# Patient Record
Sex: Female | Born: 1985 | Race: Black or African American | Hispanic: No | Marital: Single | Smoking: Former smoker
Health system: Southern US, Community
[De-identification: ages and names within clinical notes are randomized; demographics above are authoritative.]

## PROBLEM LIST (undated history)

## (undated) DIAGNOSIS — F329 Major depressive disorder, single episode, unspecified: Secondary | ICD-10-CM

## (undated) DIAGNOSIS — J45909 Unspecified asthma, uncomplicated: Secondary | ICD-10-CM

## (undated) DIAGNOSIS — F32A Depression, unspecified: Secondary | ICD-10-CM

## (undated) DIAGNOSIS — D649 Anemia, unspecified: Secondary | ICD-10-CM

## (undated) DIAGNOSIS — N888 Other specified noninflammatory disorders of cervix uteri: Secondary | ICD-10-CM

## (undated) DIAGNOSIS — F419 Anxiety disorder, unspecified: Secondary | ICD-10-CM

---
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
## 2016-08-12 ENCOUNTER — Emergency Department
Admission: EM | Admit: 2016-08-12 | Discharge: 2016-08-13 | Disposition: A | Discharge status: Home / Self Care | Payer: Self-pay | Attending: Student | Admitting: Student

## 2016-08-12 ENCOUNTER — Emergency Department: Payer: Self-pay

## 2016-08-12 ENCOUNTER — Encounter: Payer: Self-pay | Admitting: Emergency Medicine

## 2016-08-12 DIAGNOSIS — F17210 Nicotine dependence, cigarettes, uncomplicated: Secondary | ICD-10-CM | POA: Insufficient documentation

## 2016-08-12 DIAGNOSIS — R58 Hemorrhage, not elsewhere classified: Secondary | ICD-10-CM

## 2016-08-12 DIAGNOSIS — N939 Abnormal uterine and vaginal bleeding, unspecified: Secondary | ICD-10-CM | POA: Insufficient documentation

## 2016-08-12 LAB — COMPREHENSIVE METABOLIC PANEL
ALT: 12 U/L — AB (ref 14–54)
ANION GAP: 6 (ref 5–15)
AST: 23 U/L (ref 15–41)
Albumin: 4.2 g/dL (ref 3.5–5.0)
Alkaline Phosphatase: 52 U/L (ref 38–126)
BUN: 19 mg/dL (ref 6–20)
CHLORIDE: 107 mmol/L (ref 101–111)
CO2: 25 mmol/L (ref 22–32)
CREATININE: 0.7 mg/dL (ref 0.44–1.00)
Calcium: 9.1 mg/dL (ref 8.9–10.3)
Glucose, Bld: 100 mg/dL — ABNORMAL HIGH (ref 65–99)
POTASSIUM: 4.2 mmol/L (ref 3.5–5.1)
SODIUM: 138 mmol/L (ref 135–145)
Total Bilirubin: 0.5 mg/dL (ref 0.3–1.2)
Total Protein: 7.4 g/dL (ref 6.5–8.1)

## 2016-08-12 LAB — URINALYSIS COMPLETE WITH MICROSCOPIC (ARMC ONLY)
BACTERIA UA: NONE SEEN
BILIRUBIN URINE: NEGATIVE
GLUCOSE, UA: NEGATIVE mg/dL
KETONES UR: NEGATIVE mg/dL
LEUKOCYTES UA: NEGATIVE
NITRITE: NEGATIVE
PH: 7 (ref 5.0–8.0)
Protein, ur: NEGATIVE mg/dL
SPECIFIC GRAVITY, URINE: 1.017 (ref 1.005–1.030)
WBC, UA: NONE SEEN WBC/hpf (ref 0–5)

## 2016-08-12 LAB — CBC
HCT: 37.3 % (ref 35.0–47.0)
Hemoglobin: 12.4 g/dL (ref 12.0–16.0)
MCH: 28.2 pg (ref 26.0–34.0)
MCHC: 33.3 g/dL (ref 32.0–36.0)
MCV: 84.7 fL (ref 80.0–100.0)
PLATELETS: 196 10*3/uL (ref 150–440)
RBC: 4.41 MIL/uL (ref 3.80–5.20)
RDW: 15.4 % — AB (ref 11.5–14.5)
WBC: 6.9 10*3/uL (ref 3.6–11.0)

## 2016-08-12 LAB — POCT PREGNANCY, URINE: PREG TEST UR: NEGATIVE

## 2016-08-12 LAB — WET PREP, GENITAL
SPERM: NONE SEEN
TRICH WET PREP: NONE SEEN
YEAST WET PREP: NONE SEEN

## 2016-08-12 MED ORDER — IBUPROFEN 600 MG PO TABS
600.0000 mg | ORAL_TABLET | Freq: Once | ORAL | Status: AC
  Administered 2016-08-12: 600 mg via ORAL

## 2016-08-12 MED ORDER — IBUPROFEN 600 MG PO TABS
ORAL_TABLET | ORAL | Status: AC
  Administered 2016-08-12: 600 mg via ORAL
  Filled 2016-08-12: qty 1

## 2016-08-12 NOTE — ED Notes (Signed)
Patient transported to Ultrasound 

## 2016-08-12 NOTE — ED Triage Notes (Addendum)
Patient ambulatory to triage with steady gait, without difficulty or distress noted; pt reports vag bleeding x 3 weeks with lower abd cramping (LMP 7/24)

## 2016-08-12 NOTE — ED Provider Notes (Signed)
[facility] Emergency Department Provider Note   ____________________________________________   First MD Initiated Contact with Patient 08/12/16 2239     (approximate)  I have reviewed the triage vital signs and the nursing notes.   HISTORY  Chief Complaint Vaginal Bleeding    HPI [name] is a 30 y.o. female with no chronic medical problems who presents for evaluation of intermittent vaginal bleeding since 07/15/2016, currently mild, no modifying factors, gradual onset. Patient reports that since 07/15/2016 she has had vaginal bleeding and spotting on most days though she has had several days where she does not have vaginal bleeding. She is changed to regular size pads today, she changed 4 pads yesterday. She has had some lower abdominal cramping. No nausea, vomiting, diarrhea, fevers or chills. She has a history of GC chlamydia which were treated remotely. She denies any syncopal episodes or near fainting spells but reports that today when she was looking up at the clinic she began feeling somewhat lightheaded and this concerned her and in the setting of her vaginal bleeding, she decided to present for evaluation. No vision change.   History reviewed. No pertinent past medical history.  There are no active problems to display for this patient.   History reviewed. No pertinent surgical history.  Prior to Admission medications   Not on File    Allergies Demerol [meperidine]  No family history on file.  Social History Social History  Substance Use Topics  . Smoking status: Current Every Day Smoker    Packs/day: 0.50    Types: Cigarettes  . Smokeless tobacco: Never Used  . Alcohol use No    Review of Systems Constitutional: No fever/chills Eyes: No visual changes. ENT: No sore throat. Cardiovascular: Denies chest pain. Respiratory: Denies shortness of breath. Gastrointestinal: +abdominal cramping.  No nausea, no vomiting.  No diarrhea.   No constipation. Genitourinary: Negative for dysuria. Musculoskeletal: Negative for back pain. Skin: Negative for rash. Neurological: Negative for headaches, focal weakness or numbness.  10-point ROS otherwise negative.  ____________________________________________   PHYSICAL EXAM:  VITAL SIGNS: ED Triage Vitals  Enc Vitals Group     BP 08/12/16 1920 (!) 139/91     Pulse Rate 08/12/16 1920 82     Resp 08/12/16 1920 18     Temp 08/12/16 1920 98.2 F (36.8 C)     Temp Source 08/12/16 1920 Oral     SpO2 08/12/16 1920 100 %     Weight 08/12/16 1919 130 lb (59 kg)     Height 08/12/16 1919 5\' 7"  (1.702 m)     Head Circumference --      Peak Flow --      Pain Score 08/12/16 1916 9     Pain Loc --      Pain Edu? --      Excl. in GC? --     Constitutional: Alert and oriented. Well appearing and in no acute distress. Eyes: Conjunctivae are normal. PERRL. EOMI. Head: Atraumatic. Nose: No congestion/rhinnorhea. Mouth/Throat: Mucous membranes are moist.  Oropharynx non-erythematous. Neck: No stridor.   Cardiovascular: Normal rate, regular rhythm. Grossly normal heart sounds.  Good peripheral circulation. Respiratory: Normal respiratory effort.  No retractions. Lungs CTAB. Gastrointestinal: Soft and nontender. No distention.No CVA tenderness. Pelvic: Tiny specks of dark blood in the vaginal vault, closed os, no bimanual cervical motion tenderness. Musculoskeletal: No lower extremity tenderness nor edema.  No joint effusions. Neurologic:  Normal speech and language. No gross focal neurologic deficits are appreciated.  No gait instability. Skin:  Skin is warm, dry and intact. No rash noted. Psychiatric: Mood and affect are normal. Speech and behavior are normal.  ____________________________________________   LABS (all labs ordered are listed, but only abnormal results are displayed)  Labs Reviewed  URINALYSIS COMPLETEWITH MICROSCOPIC ([facility] ONLY) - Abnormal; Notable for the  following:       Result Value   Color, Urine YELLOW (*)    APPearance CLOUDY (*)    Hgb urine dipstick 1+ (*)    Squamous Epithelial / LPF 0-5 (*)    All other components within normal limits  CBC - Abnormal; Notable for the following:    RDW 15.4 (*)    All other components within normal limits  COMPREHENSIVE METABOLIC PANEL - Abnormal; Notable for the following:    Glucose, Bld 100 (*)    ALT 12 (*)    All other components within normal limits  CHLAMYDIA/NGC RT PCR ([facility] ONLY)  WET PREP, GENITAL  POC URINE PREG, ED  POCT PREGNANCY, URINE   ____________________________________________  EKG  none ____________________________________________  RADIOLOGY  Transvaginal ultrasound - pending ____________________________________________   PROCEDURES  Procedure(s) performed: None  Procedures  Critical Care performed: No  ____________________________________________   INITIAL IMPRESSION / ASSESSMENT AND PLAN / ED COURSE  Pertinent labs & imaging results that were available during my care of the patient were reviewed by me and considered in my medical decision making (see chart for details).  Erica Midgetshley Bargo is a 30 y.o. female with no chronic medical problems who presents for evaluation of intermittent vaginal bleeding since 07/15/2016, currently mild. On exam, she is very well-appearing and in no acute distress. Vital signs stable, she is afebrile. She is a benign abdominal exam. Pelvic exam reveals minimal vaginal bleeding, no I'm able or cervical motion tenderness not consistent with PID. Pregnancy test is negative. CBC shows normal hemoglobin of 12.4. CMP unremarkable. Urinalysis is not consistent with infection, 0-5 red blood cells likely contaminant from her vaginal bleeding. GC chlamydia and wet prep pending. Transvaginal ultrasound also pending. If unremarkable, anticipate that the patient can be discharged with close outpatient OB/GYN follow-up. Care transferred  to Dr. Langston MaskerShaevitz at 11 PM pending GC chlamydia, wet prep as well as transvaginal ultrasound.  Clinical Course     ____________________________________________   FINAL CLINICAL IMPRESSION(S) / ED DIAGNOSES  Final diagnoses:  Bleeding  Abnormal uterine bleeding (AUB)      NEW MEDICATIONS STARTED DURING THIS VISIT:  New Prescriptions   No medications on file     Note:  This document was prepared using Dragon voice recognition software and may include unintentional dictation errors.    Gayla DossEryka A Anuj Summons, MD 08/12/16 2306

## 2016-08-12 NOTE — ED Notes (Signed)
Pt was up to BR to obtain urine speciment, but unable to; pt has container and voices understanding to bring back to nurse once completed

## 2016-08-13 LAB — CHLAMYDIA/NGC RT PCR (ARMC ONLY)
CHLAMYDIA TR: NOT DETECTED
N GONORRHOEAE: NOT DETECTED

## 2016-08-13 NOTE — ED Provider Notes (Signed)
Signout from Dr. Inocencio HomesGayle in this 30 year old female with mild vaginal bleeding or Dr. Inocencio HomesGayle suspecting likely dysfunctional uterine bleeding.  With chronic bacterial vaginosis. Plan is to follow up with Dr. Tiburcio PeaHarris of obstetrics and gynecology as long as her ultrasound does not come back with any concerning acute pathology that will require urgent treatment. Physical Exam  BP (!) 125/94   Pulse 81   Temp 98.2 F (36.8 C) (Oral)   Resp 16   Ht 5\' 7"  (1.702 m)   Wt 130 lb (59 kg)   LMP 07/15/2016 (Exact Date)   SpO2 98%   BMI 20.36 kg/m  ----------------------------------------- 12:56 AM on 08/13/2016 -----------------------------------------   Physical Exam Patient sleeping when I entered the room. No acute distress. Easily arousable. ED Course  Procedures  MDM  US Transvaginal Non-OB (Accession [id number]) (Order [id number])  Imaging  Date: 08/12/2016 Department: [facility] EMERGENCY DEPARTMENT Released By/Authorizing: Gayla DossEryka A Gayle, MD (auto-released)  PACS Images   Show images for US Transvaginal Non-OB  Study Result   CLINICAL DATA:  Vaginal bleeding for 3 weeks.  EXAM: TRANSABDOMINAL AND TRANSVAGINAL ULTRASOUND OF PELVIS  TECHNIQUE: Both transabdominal and transvaginal ultrasound examinations of the pelvis were performed. Transabdominal technique was performed for global imaging of the pelvis including uterus, ovaries, adnexal regions, and pelvic cul-de-sac. It was necessary to proceed with endovaginal exam following the transabdominal exam to visualize the uterus, endometrium, ovaries and adnexa.  COMPARISON:  None  FINDINGS: Uterus  Measurements: 7.5 x 4.8 x 5.5 cm. No fibroids or other mass visualized.  Endometrium  Thickness: 10-12 mm. Trilaminar in appearance. Echogenic fluid noted in the endometrial canal consistent with blood products. No hyperemia.  Right ovary  Measurements: 3.2 x 1.8 x 2.2 cm. Multiple follicles,  peripherally oriented, but not increased in number. Blood flow is seen. No adnexal mass.  Left ovary  Measurements: 4.2 x 2.2 x 3.9 cm. Follicular cysts largest measuring 1.4 cm. Normal blood flow. No adnexal mass.  Other findings  Within the cervix there is a 1.3 x 1.1 x 1.1 cm rounded lesion with homogeneous internal echoes, increased through transmission, and no internal vascularity. No abnormal free fluid.  IMPRESSION: 1. Echogenic fluid in the endometrial canal consistent with blood products. Normal endometrial thickness 4 premenopausal patient. If bleeding is unresponsive to hormonal or medical therapy, sonohysterography recommended for focal lesion workup. 2. Cervical lesion is most consistent with a complex cyst, possible nabothian cyst. 3. Physiologic follicles in both ovaries with small follicular cysts on the left. Normal blood flow.   Electronically Signed   By: Rubye OaksMelanie  Ehinger M.D.   On: 08/13/2016 00:22   I reviewed the imaging results with the patient. She is understanding of the plan for follow-up as an outpatient.      Myrna Blazeravid Matthew Schaevitz, MD 08/13/16 [phone]

## 2016-10-11 ENCOUNTER — Encounter: Payer: Self-pay | Admitting: *Deleted

## 2016-10-11 ENCOUNTER — Ambulatory Visit
Admission: EM | Admit: 2016-10-11 | Discharge: 2016-10-11 | Disposition: A | Discharge status: Home / Self Care | Payer: Self-pay | Attending: Family Medicine | Admitting: Family Medicine

## 2016-10-11 ENCOUNTER — Telehealth: Payer: Self-pay | Admitting: Emergency Medicine

## 2016-10-11 DIAGNOSIS — Z3490 Encounter for supervision of normal pregnancy, unspecified, unspecified trimester: Secondary | ICD-10-CM

## 2016-10-11 DIAGNOSIS — N898 Other specified noninflammatory disorders of vagina: Secondary | ICD-10-CM

## 2016-10-11 DIAGNOSIS — Z3201 Encounter for pregnancy test, result positive: Secondary | ICD-10-CM

## 2016-10-11 DIAGNOSIS — R300 Dysuria: Secondary | ICD-10-CM

## 2016-10-11 HISTORY — DX: Unspecified asthma, uncomplicated: J45.909

## 2016-10-11 LAB — WET PREP, GENITAL
CLUE CELLS WET PREP: NONE SEEN
Sperm: NONE SEEN
Trich, Wet Prep: NONE SEEN
YEAST WET PREP: NONE SEEN

## 2016-10-11 LAB — URINALYSIS COMPLETE WITH MICROSCOPIC (ARMC ONLY)
Bilirubin Urine: NEGATIVE
GLUCOSE, UA: NEGATIVE mg/dL
Hgb urine dipstick: NEGATIVE
Ketones, ur: NEGATIVE mg/dL
Leukocytes, UA: NEGATIVE
NITRITE: NEGATIVE
Protein, ur: NEGATIVE mg/dL
SPECIFIC GRAVITY, URINE: 1.025 (ref 1.005–1.030)
WBC, UA: NONE SEEN WBC/hpf (ref 0–5)
pH: 7 (ref 5.0–8.0)

## 2016-10-11 LAB — CHLAMYDIA/NGC RT PCR (ARMC ONLY)
Chlamydia Tr: NOT DETECTED
N gonorrhoeae: NOT DETECTED

## 2016-10-11 LAB — PREGNANCY, URINE: PREG TEST UR: POSITIVE — AB

## 2016-10-11 MED ORDER — MAGNESIUM HYDROXIDE 400 MG/5ML PO SUSP: 30.0000 mL | Freq: Once | ORAL | Status: DC

## 2016-10-11 MED ORDER — NITROFURANTOIN MONOHYD MACRO 100 MG PO CAPS: 100.0000 mg | ORAL_CAPSULE | Freq: Two times a day (BID) | ORAL | 0 refills | Status: DC

## 2016-10-11 NOTE — ED Provider Notes (Signed)
[facility]    CSN: [id number] Arrival date & time: 10/11/16  1249     History   Chief Complaint Chief Complaint  Patient presents with  . Abdominal Pain  . Urinary Frequency  . Urinary Urgency  . Nausea    HPI [name] is a 30 y.o. female.   30 yo female with a c/o lower mid abdomen/pelvic pressure sensation,  vaginal discharge, urinary frequency and urgency as well as nausea x2 weeks. Denies any fevers, chills, vomiting.        The history is provided by the patient.    Past Medical History:  Diagnosis Date  . Asthma     There are no active problems to display for this patient.   History reviewed. No pertinent surgical history.  OB History    No data available       Home Medications    Prior to Admission medications   Medication Sig Start Date End Date Taking? Authorizing Provider  albuterol (PROVENTIL HFA;VENTOLIN HFA) 108 (90 Base) MCG/ACT inhaler Inhale into the lungs every 6 (six) hours as needed for wheezing or shortness of breath.    Historical Provider, MD  nitrofurantoin, macrocrystal-monohydrate, (MACROBID) 100 MG capsule Take 1 capsule (100 mg total) by mouth 2 (two) times daily. 10/11/16   Payton Mccallum, MD    Family History History reviewed. No pertinent family history.  Social History Social History  Substance Use Topics  . Smoking status: Current Every Day Smoker    Packs/day: 0.50    Types: Cigarettes  . Smokeless tobacco: Never Used  . Alcohol use No     Allergies   Demerol [meperidine]   Review of Systems Review of Systems   Physical Exam Triage Vital Signs ED Triage Vitals  Enc Vitals Group     BP 10/11/16 1303 123/71     Pulse Rate 10/11/16 1303 74     Resp 10/11/16 1303 16     Temp 10/11/16 1303 99.2 F (37.3 C)     Temp Source 10/11/16 1303 Oral     SpO2 10/11/16 1303 99 %     Weight 10/11/16 1305 130 lb (59 kg)     Height 10/11/16 1305  (1.702 m)     Head Circumference --    Peak Flow --      Pain Score --      Pain Loc --      Pain Edu? --      Excl. in GC? --    No data found.   Updated Vital Signs BP 123/71 (BP Location: Left Arm)   Pulse 74   Temp 99.2 F (37.3 C) (Oral)   Resp 16   Ht  (1.702 m)   Wt 130 lb (59 kg)   LMP 08/31/2016 (Exact Date)   SpO2 99%   BMI 20.36 kg/m   Visual Acuity Right Eye Distance:   Left Eye Distance:   Bilateral Distance:    Right Eye Near:   Left Eye Near:    Bilateral Near:     Physical Exam  Constitutional: She appears well-developed.  Pulmonary/Chest: Effort normal. No respiratory distress.  Abdominal: Soft. Bowel sounds are normal. She exhibits no distension and no mass. There is tenderness. There is no rebound and no guarding.  Genitourinary: Pelvic exam was performed with patient supine. Cervix exhibits no discharge and no friability. Vaginal discharge found.  Neurological: She is alert.  Skin: Skin is warm and dry. No rash noted. She  is not diaphoretic. No erythema.  Nursing note and vitals reviewed.    UC Treatments / Results  Labs (all labs ordered are listed, but only abnormal results are displayed) Labs Reviewed  WET PREP, GENITAL - Abnormal; Notable for the following:       Result Value   WBC, Wet Prep HPF POC MODERATE (*)    All other components within normal limits  URINALYSIS COMPLETEWITH MICROSCOPIC ([facility] ONLY) - Abnormal; Notable for the following:    APPearance HAZY (*)    Bacteria, UA FEW (*)    Squamous Epithelial / LPF 6-30 (*)    All other components within normal limits  PREGNANCY, URINE - Abnormal; Notable for the following:    Preg Test, Ur POSITIVE (*)    All other components within normal limits  CHLAMYDIA/NGC RT PCR [facility] ONLY)    EKG  EKG Interpretation None       Radiology No results found.  Procedures Procedures (including critical care time)  Medications Ordered in UC Medications - No data to display   Initial Impression /  Assessment and Plan / UC Course  I have reviewed the triage vital signs and the nursing notes.  Pertinent labs & imaging results that were available during my care of the patient were reviewed by me and considered in my medical decision making (see chart for details).  Clinical Course      Final Clinical Impressions(s) / UC Diagnoses   Final diagnoses:  Dysuria  Vaginal discharge  Pregnancy, unspecified gestational age    New Prescriptions Discharge Medication List as of 10/11/2016  2:24 PM    START taking these medications   Details  nitrofurantoin, macrocrystal-monohydrate, (MACROBID) 100 MG capsule Take 1 capsule (100 mg total) by mouth 2 (two) times daily., Starting Fri 10/11/2016, Normal       1. Lab results and diagnosis reviewed with patient 2. rx as per orders above; reviewed possible side effects, interactions, risks and benefits  3. Recommend patient start prenatal vitamins and establish with OB/GYN 4. Will be notified of pending tests (GC/Chlamydia) 5. Follow-up prn if symptoms worsen or don't improve   Payton Mccallum, MD 10/11/16 [phone]

## 2016-10-11 NOTE — Telephone Encounter (Signed)
Patient notified that her GC/Chlamydia results were Negative.  Patient verbalized understanding of her lab results.  Patient was instructed to follow-up with her OB as discussed at this visit.  Patient verbalized understanding.

## 2016-10-11 NOTE — ED Triage Notes (Signed)
Lower quad abd pain, vaginal discharge, urinary freq/urg, and nausea x2 weeks.

## 2016-11-13 LAB — OB RESULTS CONSOLE GC/CHLAMYDIA
CHLAMYDIA, DNA PROBE: NEGATIVE
Gonorrhea: NEGATIVE

## 2016-11-13 LAB — OB RESULTS CONSOLE VARICELLA ZOSTER ANTIBODY, IGG: Varicella: IMMUNE

## 2016-11-13 LAB — OB RESULTS CONSOLE HEPATITIS B SURFACE ANTIGEN: Hepatitis B Surface Ag: NEGATIVE

## 2016-11-13 LAB — OB RESULTS CONSOLE RUBELLA ANTIBODY, IGM: RUBELLA: IMMUNE

## 2016-11-25 ENCOUNTER — Other Ambulatory Visit (HOSPITAL_COMMUNITY): Payer: Self-pay | Admitting: Family Medicine

## 2016-11-25 DIAGNOSIS — Z369 Encounter for antenatal screening, unspecified: Secondary | ICD-10-CM

## 2016-11-28 ENCOUNTER — Ambulatory Visit: Admission: RE | Admit: 2016-11-28 | Payer: Self-pay | Source: Ambulatory Visit

## 2016-11-28 ENCOUNTER — Ambulatory Visit: Payer: Self-pay

## 2016-12-05 ENCOUNTER — Encounter: Payer: Self-pay | Admitting: *Deleted

## 2016-12-05 ENCOUNTER — Ambulatory Visit
Admission: RE | Admit: 2016-12-05 | Discharge: 2016-12-05 | Disposition: A | Discharge status: Home / Self Care | Payer: Medicaid Other | Source: Ambulatory Visit | Attending: Family Medicine | Admitting: Family Medicine

## 2016-12-05 ENCOUNTER — Ambulatory Visit
Admission: RE | Admit: 2016-12-05 | Discharge: 2016-12-05 | Disposition: A | Discharge status: Home / Self Care | Payer: Medicaid Other | Source: Ambulatory Visit | Attending: Maternal & Fetal Medicine | Admitting: Maternal & Fetal Medicine

## 2016-12-05 VITALS — BP 133/78 | HR 96 | Temp 98.3°F | Resp 18 | Wt 143.4 lb

## 2016-12-05 DIAGNOSIS — Z3A14 14 weeks gestation of pregnancy: Secondary | ICD-10-CM | POA: Diagnosis not present

## 2016-12-05 DIAGNOSIS — Z369 Encounter for antenatal screening, unspecified: Secondary | ICD-10-CM | POA: Diagnosis not present

## 2016-12-05 DIAGNOSIS — Z3490 Encounter for supervision of normal pregnancy, unspecified, unspecified trimester: Secondary | ICD-10-CM

## 2016-12-05 DIAGNOSIS — Z3491 Encounter for supervision of normal pregnancy, unspecified, first trimester: Secondary | ICD-10-CM | POA: Diagnosis not present

## 2016-12-05 HISTORY — DX: Other specified noninflammatory disorders of cervix uteri: N88.8

## 2016-12-05 NOTE — Progress Notes (Signed)
Referring physician:  [facility] Length of Consultation: 30 minutes   [name]  was referred to [facility] for genetic counseling to review prenatal screening and testing options.  This note summarizes the information we discussed.    We offered the following routine screening tests for this pregnancy:  First trimester screening, which includes nuchal translucency ultrasound screen and first trimester maternal serum marker screening.  The nuchal translucency has approximately an 80% detection rate for Down syndrome and can be positive for other chromosome abnormalities as well as congenital heart defects.  When combined with a maternal serum marker screening, the detection rate is up to 90% for Down syndrome and up to 97% for trisomy 18.     Maternal serum marker screening, a blood test that measures pregnancy proteins, can provide risk assessments for Down syndrome, trisomy 18, and open neural tube defects (spina bifida, anencephaly). Because it does not directly examine the fetus, it cannot positively diagnose or rule out these problems.  Targeted ultrasound uses high frequency sound waves to create an image of the developing fetus.  An ultrasound is often recommended as a routine means of evaluating the pregnancy.  It is also used to screen for fetal anatomy problems (for example, a heart defect) that might be suggestive of a chromosomal or other abnormality.   Should these screening tests indicate an increased concern, then the following additional testing options would be offered:  The chorionic villus sampling procedure is available for first trimester chromosome analysis.  This involves the withdrawal of a small amount of chorionic villi (tissue from the developing placenta).  Risk of pregnancy loss is estimated to be approximately 1 in 200 to 1 in 100 (0.5 to 1%).  There is approximately a 1% (1 in 100) chance that the CVS chromosome results will  be unclear.  Chorionic villi cannot be tested for neural tube defects.     Amniocentesis involves the removal of a small amount of amniotic fluid from the sac surrounding the fetus with the use of a thin needle inserted through the maternal abdomen and uterus.  Ultrasound guidance is used throughout the procedure.  Fetal cells from amniotic fluid are directly evaluated and > 99.5% of chromosome problems and > 98% of open neural tube defects can be detected. This procedure is generally performed after the 15th week of pregnancy.  The main risks to this procedure include complications leading to miscarriage in less than 1 in 200 cases (0.5%).  As another option for information if the pregnancy is suspected to be an an increased chance for certain chromosome conditions, we also reviewed the availability of cell free fetal DNA testing from maternal blood to determine whether or not the baby may have either Down syndrome, trisomy 5013, or trisomy 8018.  This test utilizes a maternal blood sample and DNA sequencing technology to isolate circulating cell free fetal DNA from maternal plasma.  The fetal DNA can then be analyzed for DNA sequences that are derived from the three most common chromosomes involved in aneuploidy, chromosomes 13, 18, and 21.  If the overall amount of DNA is greater than the expected level for any of these chromosomes, aneuploidy is suspected.  While we do not consider it a replacement for invasive testing and karyotype analysis, a negative result from this testing would be reassuring, though not a guarantee of a normal chromosome complement for the baby.  An abnormal result is certainly suggestive of an abnormal chromosome complement,  though we would still recommend CVS or amniocentesis to confirm any findings from this testing.   Cystic Fibrosis and Spinal Muscular Atrophy (SMA) screening were also discussed with the patient. Both conditions are recessive, which means that both parents must be  carriers in order to have a child with the disease.  Cystic fibrosis (CF) is one of the most common genetic conditions in persons of Caucasian ancestry.  This condition occurs in approximately 1 in 2,500 Caucasian persons and results in thickened secretions in the lungs, digestive, and reproductive systems.  For a baby to be at risk for having CF, both of the parents must be carriers for this condition.  Approximately 1 in 70 Caucasian persons is a carrier for CF.  Current carrier testing looks for the most common mutations in the gene for CF and can detect approximately 90% of carriers in the Caucasian population.  This means that the carrier screening can greatly reduce, but cannot eliminate, the chance for an individual to have a child with CF.  If an individual is found to be a carrier for CF, then carrier testing would be available for the partner. As part of [place]'s newborn screening profile, all babies born in the state of [place] will have a two-tier screening process.  Specimens are first tested to determine the concentration of immunoreactive trypsinogen (IRT).  The top 5% of specimens with the highest IRT values then undergo DNA testing using a panel of over 40 common CF mutations. SMA is a neurodegenerative disorder that leads to atrophy of skeletal muscle and overall weakness.  This condition is also more prevalent in the Caucasian population, with 1 in 40-1 in 60 persons being a carrier and 1 in 6,000-1 in 10,000 children being affected.  There are multiple forms of the disease, with some causing death in infancy to other forms with survival into adulthood.  The genetics of SMA is complex, but carrier screening can detect up to 95% of carriers in the Caucasian population.  Similar to CF, a negative result can greatly reduce, but cannot eliminate, the chance to have a child with SMA.  We obtained a detailed family history and pregnancy history.  [name] stated that she has two  maternal first cousins with sickle cell disease.  The patient had testing for hemoglobinopathies through [facility] which was normal and her MCV is 84, which greatly reduces the chance for her to be a carrier for sickle cell, thalassemia or other hemoglobinopathy.  The remainder of the family history was reported to be unremarkable for birth defects, mental retardation, recurrent pregnancy loss or known chromosome abnormalities.  [name] reported that this is her fourth pregnancy.  She has two sons, ages 4 and 18, who are in good health and had one early miscarriage.  She reported no complications in this pregnancy.  [name] did report smoking cigarettes and marijuana.  She has cut back on cigarettes and stated that she is no longer smoking marijuana.  We reviewed that both of these exposures may increase the chance for low birth weight, preterm delivery and poor pregnancy outcomes.  For this reason, we recommend that she avoid these exposures during pregnancy.  After consideration of the options, [name] elected to proceed with an ultrasound only. [name] declined all other screening and testing options including first trimester screening, CF and SMA testing.  An ultrasound was performed at the time of the visit.  The gestational age was consistent with  14 weeks,  5 days.  Fetal anatomy could not be assessed due to early gestational age.  Please refer to the ultrasound report for details of that study.  The patient was scheduled to return for an anatomy ultrasound at approximately [redacted] weeks gestation.  [name] was encouraged to call with questions or concerns.  We can be contacted at [phone].    Erica Andersoneborah F. Samyia Motter, MS, CGC

## 2016-12-05 NOTE — Progress Notes (Signed)
Patient seen by me, agree with assessment and plan as outlined in [facility]'s note.  

## 2016-12-26 ENCOUNTER — Other Ambulatory Visit: Payer: Self-pay | Admitting: *Deleted

## 2016-12-26 DIAGNOSIS — O99332 Smoking (tobacco) complicating pregnancy, second trimester: Secondary | ICD-10-CM

## 2016-12-30 ENCOUNTER — Ambulatory Visit
Admission: RE | Admit: 2016-12-30 | Discharge: 2016-12-30 | Disposition: A | Discharge status: Home / Self Care | Payer: Medicaid Other | Source: Ambulatory Visit | Attending: Obstetrics & Gynecology | Admitting: Obstetrics & Gynecology

## 2016-12-30 DIAGNOSIS — O99332 Smoking (tobacco) complicating pregnancy, second trimester: Secondary | ICD-10-CM | POA: Diagnosis not present

## 2016-12-30 DIAGNOSIS — Z3A18 18 weeks gestation of pregnancy: Secondary | ICD-10-CM | POA: Insufficient documentation

## 2017-03-17 ENCOUNTER — Emergency Department: Payer: Medicaid Other

## 2017-03-17 ENCOUNTER — Inpatient Hospital Stay: Payer: Medicaid Other | Admitting: Anesthesiology

## 2017-03-17 ENCOUNTER — Inpatient Hospital Stay: Payer: Medicaid Other

## 2017-03-17 ENCOUNTER — Encounter
Admission: EM | Disposition: A | Discharge status: Home / Self Care | Payer: Self-pay | Source: Home / Self Care | Attending: Obstetrics and Gynecology

## 2017-03-17 ENCOUNTER — Encounter: Payer: Self-pay | Admitting: Medical Oncology

## 2017-03-17 ENCOUNTER — Inpatient Hospital Stay
Admission: EM | Admit: 2017-03-17 | Discharge: 2017-03-19 | DRG: 765 | Disposition: A | Discharge status: Home / Self Care | Payer: Medicaid Other | Attending: Obstetrics and Gynecology | Admitting: Obstetrics and Gynecology

## 2017-03-17 DIAGNOSIS — O0993 Supervision of high risk pregnancy, unspecified, third trimester: Secondary | ICD-10-CM

## 2017-03-17 DIAGNOSIS — O4593 Premature separation of placenta, unspecified, third trimester: Secondary | ICD-10-CM | POA: Diagnosis present

## 2017-03-17 DIAGNOSIS — O9081 Anemia of the puerperium: Secondary | ICD-10-CM | POA: Diagnosis not present

## 2017-03-17 DIAGNOSIS — D62 Acute posthemorrhagic anemia: Secondary | ICD-10-CM | POA: Diagnosis not present

## 2017-03-17 DIAGNOSIS — O368130 Decreased fetal movements, third trimester, not applicable or unspecified: Secondary | ICD-10-CM | POA: Diagnosis present

## 2017-03-17 DIAGNOSIS — J45909 Unspecified asthma, uncomplicated: Secondary | ICD-10-CM | POA: Diagnosis present

## 2017-03-17 DIAGNOSIS — N939 Abnormal uterine and vaginal bleeding, unspecified: Secondary | ICD-10-CM

## 2017-03-17 DIAGNOSIS — O4590 Premature separation of placenta, unspecified, unspecified trimester: Secondary | ICD-10-CM

## 2017-03-17 DIAGNOSIS — O99334 Smoking (tobacco) complicating childbirth: Secondary | ICD-10-CM | POA: Diagnosis present

## 2017-03-17 DIAGNOSIS — F1290 Cannabis use, unspecified, uncomplicated: Secondary | ICD-10-CM | POA: Diagnosis present

## 2017-03-17 DIAGNOSIS — J4520 Mild intermittent asthma, uncomplicated: Secondary | ICD-10-CM

## 2017-03-17 DIAGNOSIS — O99324 Drug use complicating childbirth: Secondary | ICD-10-CM | POA: Diagnosis present

## 2017-03-17 DIAGNOSIS — O9952 Diseases of the respiratory system complicating childbirth: Secondary | ICD-10-CM | POA: Diagnosis present

## 2017-03-17 DIAGNOSIS — F17210 Nicotine dependence, cigarettes, uncomplicated: Secondary | ICD-10-CM | POA: Diagnosis present

## 2017-03-17 DIAGNOSIS — Z885 Allergy status to narcotic agent status: Secondary | ICD-10-CM

## 2017-03-17 DIAGNOSIS — Z3A29 29 weeks gestation of pregnancy: Secondary | ICD-10-CM

## 2017-03-17 DIAGNOSIS — Z09 Encounter for follow-up examination after completed treatment for conditions other than malignant neoplasm: Secondary | ICD-10-CM

## 2017-03-17 DIAGNOSIS — O4693 Antepartum hemorrhage, unspecified, third trimester: Secondary | ICD-10-CM | POA: Diagnosis present

## 2017-03-17 DIAGNOSIS — R1084 Generalized abdominal pain: Secondary | ICD-10-CM

## 2017-03-17 LAB — URINE DRUG SCREEN, QUALITATIVE (ARMC ONLY)
AMPHETAMINES, UR SCREEN: NOT DETECTED
BENZODIAZEPINE, UR SCRN: POSITIVE — AB
Barbiturates, Ur Screen: NOT DETECTED
COCAINE METABOLITE, UR ~~LOC~~: NOT DETECTED
Cannabinoid 50 Ng, Ur ~~LOC~~: POSITIVE — AB
MDMA (ECSTASY) UR SCREEN: NOT DETECTED
METHADONE SCREEN, URINE: NOT DETECTED
OPIATE, UR SCREEN: NOT DETECTED
Phencyclidine (PCP) Ur S: NOT DETECTED
Tricyclic, Ur Screen: NOT DETECTED

## 2017-03-17 LAB — CBC WITH DIFFERENTIAL/PLATELET
BASOS ABS: 0.1 10*3/uL (ref 0–0.1)
Basophils Relative: 1 %
EOS ABS: 0.1 10*3/uL (ref 0–0.7)
EOS PCT: 1 %
HCT: 35.3 % (ref 35.0–47.0)
Hemoglobin: 11.8 g/dL — ABNORMAL LOW (ref 12.0–16.0)
LYMPHS ABS: 2.8 10*3/uL (ref 1.0–3.6)
Lymphocytes Relative: 26 %
MCH: 28.8 pg (ref 26.0–34.0)
MCHC: 33.3 g/dL (ref 32.0–36.0)
MCV: 86.4 fL (ref 80.0–100.0)
Monocytes Absolute: 0.6 10*3/uL (ref 0.2–0.9)
Monocytes Relative: 6 %
Neutro Abs: 7.1 10*3/uL — ABNORMAL HIGH (ref 1.4–6.5)
Neutrophils Relative %: 66 %
PLATELETS: 158 10*3/uL (ref 150–440)
RBC: 4.09 MIL/uL (ref 3.80–5.20)
RDW: 14.6 % — AB (ref 11.5–14.5)
WBC: 10.7 10*3/uL (ref 3.6–11.0)

## 2017-03-17 LAB — COMPREHENSIVE METABOLIC PANEL
ALBUMIN: 3 g/dL — AB (ref 3.5–5.0)
ALK PHOS: 96 U/L (ref 38–126)
ALT: 11 U/L — ABNORMAL LOW (ref 14–54)
ANION GAP: 6 (ref 5–15)
AST: 25 U/L (ref 15–41)
BILIRUBIN TOTAL: 0.4 mg/dL (ref 0.3–1.2)
BUN: 10 mg/dL (ref 6–20)
CALCIUM: 8.4 mg/dL — AB (ref 8.9–10.3)
CO2: 21 mmol/L — ABNORMAL LOW (ref 22–32)
Chloride: 110 mmol/L (ref 101–111)
Creatinine, Ser: 0.69 mg/dL (ref 0.44–1.00)
GFR calc Af Amer: >60 mL/min (ref >60)
GFR calc non Af Amer: >60 mL/min (ref >60)
GLUCOSE: 89 mg/dL (ref 65–99)
Potassium: 4.2 mmol/L (ref 3.5–5.1)
Sodium: 137 mmol/L (ref 135–145)
TOTAL PROTEIN: 6.4 g/dL — AB (ref 6.5–8.1)

## 2017-03-17 LAB — RAPID HIV SCREEN (HIV 1/2 AB+AG)
HIV 1/2 Antibodies: NONREACTIVE
HIV-1 P24 Antigen - HIV24: NONREACTIVE

## 2017-03-17 LAB — HCG, QUANTITATIVE, PREGNANCY: hCG, Beta Chain, Quant, S: 47452 m[IU]/mL — ABNORMAL HIGH (ref <5)

## 2017-03-17 LAB — LIPASE, BLOOD: Lipase: 13 U/L (ref 11–51)

## 2017-03-17 SURGERY — Surgical Case
Anesthesia: General | Site: Abdomen | Wound class: Clean Contaminated

## 2017-03-17 MED ORDER — MENTHOL 3 MG MT LOZG
1.0000 | LOZENGE | OROMUCOSAL | Status: DC | PRN
  Filled 2017-03-17 (×4): qty 9

## 2017-03-17 MED ORDER — FERROUS SULFATE 325 (65 FE) MG PO TABS
325.0000 mg | ORAL_TABLET | Freq: Two times a day (BID) | ORAL | Status: DC
  Administered 2017-03-18 – 2017-03-19 (×2): 325 mg via ORAL
  Filled 2017-03-17 (×2): qty 1

## 2017-03-17 MED ORDER — ONDANSETRON HCL 4 MG/2ML IJ SOLN
INTRAMUSCULAR | Status: AC
  Filled 2017-03-17: qty 2

## 2017-03-17 MED ORDER — IBUPROFEN 600 MG PO TABS
600.0000 mg | ORAL_TABLET | Freq: Four times a day (QID) | ORAL | Status: DC
  Administered 2017-03-18: 600 mg via ORAL
  Filled 2017-03-17: qty 1

## 2017-03-17 MED ORDER — LIDOCAINE HCL (CARDIAC) 20 MG/ML IV SOLN
INTRAVENOUS | Status: DC | PRN
  Administered 2017-03-17: 100 mg via INTRATRACHEAL

## 2017-03-17 MED ORDER — LACTATED RINGERS IV SOLN
INTRAVENOUS | Status: DC | PRN
  Administered 2017-03-17: 13:00:00 via INTRAVENOUS

## 2017-03-17 MED ORDER — OXYTOCIN 10 UNIT/ML IJ SOLN
INTRAMUSCULAR | Status: DC | PRN
  Administered 2017-03-17: 40 [IU] via INTRAMUSCULAR

## 2017-03-17 MED ORDER — PRENATAL MULTIVITAMIN CH
1.0000 | ORAL_TABLET | Freq: Every day | ORAL | Status: DC
  Administered 2017-03-19: 1 via ORAL
  Filled 2017-03-17: qty 1

## 2017-03-17 MED ORDER — OXYCODONE-ACETAMINOPHEN 5-325 MG PO TABS: 1.0000 | ORAL_TABLET | ORAL | Status: DC | PRN

## 2017-03-17 MED ORDER — ALBUTEROL SULFATE (2.5 MG/3ML) 0.083% IN NEBU
2.5000 mg | INHALATION_SOLUTION | Freq: Four times a day (QID) | RESPIRATORY_TRACT | Status: DC | PRN
Start: 2017-03-17 — End: 2017-03-19
  Administered 2017-03-17 – 2017-03-19 (×2): 2.5 mg via RESPIRATORY_TRACT
  Filled 2017-03-17 (×2): qty 3

## 2017-03-17 MED ORDER — ACETAMINOPHEN 325 MG PO TABS: 650.0000 mg | ORAL_TABLET | ORAL | Status: DC | PRN

## 2017-03-17 MED ORDER — OXYTOCIN 40 UNITS IN LACTATED RINGERS INFUSION - SIMPLE MED
2.5000 [IU]/h | INTRAVENOUS | Status: AC
  Administered 2017-03-17: 2.5 [IU]/h via INTRAVENOUS
  Filled 2017-03-17: qty 1000

## 2017-03-17 MED ORDER — FENTANYL CITRATE (PF) 100 MCG/2ML IJ SOLN
25.0000 ug | INTRAMUSCULAR | Status: AC | PRN
  Administered 2017-03-17 (×6): 25 ug via INTRAVENOUS

## 2017-03-17 MED ORDER — SUCCINYLCHOLINE CHLORIDE 20 MG/ML IJ SOLN
INTRAMUSCULAR | Status: DC | PRN
Start: 2017-03-17 — End: 2017-03-17
  Administered 2017-03-17: 100 mg via INTRAVENOUS

## 2017-03-17 MED ORDER — ACETAMINOPHEN 10 MG/ML IV SOLN
INTRAVENOUS | Status: AC
  Filled 2017-03-17: qty 100

## 2017-03-17 MED ORDER — LACTATED RINGERS IV SOLN
INTRAVENOUS | Status: DC
  Administered 2017-03-18: 04:00:00 via INTRAVENOUS

## 2017-03-17 MED ORDER — ONDANSETRON HCL 4 MG/2ML IJ SOLN
INTRAMUSCULAR | Status: DC | PRN
  Administered 2017-03-17: 4 mg via INTRAVENOUS

## 2017-03-17 MED ORDER — PROPOFOL 10 MG/ML IV BOLUS
INTRAVENOUS | Status: DC | PRN
  Administered 2017-03-17: 50 mg via INTRAVENOUS
  Administered 2017-03-17: 150 mg via INTRAVENOUS

## 2017-03-17 MED ORDER — ONDANSETRON HCL 4 MG/2ML IJ SOLN
4.0000 mg | Freq: Once | INTRAMUSCULAR | Status: AC | PRN
  Administered 2017-03-17: 4 mg via INTRAVENOUS

## 2017-03-17 MED ORDER — HYDROMORPHONE HCL 1 MG/ML IJ SOLN
1.0000 mg | INTRAMUSCULAR | Status: DC | PRN
  Administered 2017-03-17 – 2017-03-18 (×5): 1 mg via INTRAVENOUS
  Filled 2017-03-17 (×5): qty 1

## 2017-03-17 MED ORDER — COCONUT OIL OIL
1.0000 | TOPICAL_OIL | Status: DC | PRN
Start: 2017-03-17 — End: 2017-03-19
  Filled 2017-03-17: qty 120

## 2017-03-17 MED ORDER — SENNOSIDES-DOCUSATE SODIUM 8.6-50 MG PO TABS
2.0000 | ORAL_TABLET | ORAL | Status: DC
  Administered 2017-03-18: 2 via ORAL
  Filled 2017-03-17: qty 2

## 2017-03-17 MED ORDER — SIMETHICONE 80 MG PO CHEW
80.0000 mg | CHEWABLE_TABLET | Freq: Three times a day (TID) | ORAL | Status: DC
  Administered 2017-03-18 – 2017-03-19 (×3): 80 mg via ORAL
  Filled 2017-03-17 (×2): qty 1

## 2017-03-17 MED ORDER — OXYTOCIN 10 UNIT/ML IJ SOLN
INTRAMUSCULAR | Status: AC
Start: 2017-03-17 — End: 2017-03-17
  Filled 2017-03-17: qty 1

## 2017-03-17 MED ORDER — WITCH HAZEL-GLYCERIN EX PADS: 1.0000 "application " | MEDICATED_PAD | CUTANEOUS | Status: DC | PRN

## 2017-03-17 MED ORDER — OXYTOCIN 10 UNIT/ML IJ SOLN
INTRAMUSCULAR | Status: AC
  Filled 2017-03-17: qty 4

## 2017-03-17 MED ORDER — FENTANYL CITRATE (PF) 100 MCG/2ML IJ SOLN
INTRAMUSCULAR | Status: DC | PRN
  Administered 2017-03-17 (×2): 50 ug via INTRAVENOUS

## 2017-03-17 MED ORDER — FENTANYL CITRATE (PF) 100 MCG/2ML IJ SOLN
INTRAMUSCULAR | Status: AC
  Filled 2017-03-17: qty 2

## 2017-03-17 MED ORDER — OXYTOCIN 40 UNITS IN LACTATED RINGERS INFUSION - SIMPLE MED
INTRAVENOUS | Status: DC | PRN
  Administered 2017-03-17: 1000 mL via INTRAVENOUS

## 2017-03-17 MED ORDER — LORAZEPAM 2 MG/ML IJ SOLN
INTRAMUSCULAR | Status: AC
  Filled 2017-03-17: qty 1

## 2017-03-17 MED ORDER — ACETAMINOPHEN 10 MG/ML IV SOLN
INTRAVENOUS | Status: DC | PRN
  Administered 2017-03-17: 1000 mg via INTRAVENOUS

## 2017-03-17 MED ORDER — OXYCODONE-ACETAMINOPHEN 5-325 MG PO TABS
2.0000 | ORAL_TABLET | ORAL | Status: DC | PRN
  Administered 2017-03-18 – 2017-03-19 (×4): 2 via ORAL
  Filled 2017-03-17 (×4): qty 2

## 2017-03-17 MED ORDER — MIDAZOLAM HCL 2 MG/2ML IJ SOLN
INTRAMUSCULAR | Status: DC | PRN
  Administered 2017-03-17: 2 mg via INTRAVENOUS

## 2017-03-17 MED ORDER — DIPHENHYDRAMINE HCL 25 MG PO CAPS: 25.0000 mg | ORAL_CAPSULE | Freq: Four times a day (QID) | ORAL | Status: DC | PRN

## 2017-03-17 MED ORDER — DEXTROSE IN LACTATED RINGERS 5 % IV SOLN
INTRAVENOUS | Status: DC | PRN
Start: 2017-03-17 — End: 2017-03-17
  Administered 2017-03-17: 12:00:00 via INTRAVENOUS

## 2017-03-17 MED ORDER — DIBUCAINE 1 % RE OINT: 1.0000 "application " | TOPICAL_OINTMENT | RECTAL | Status: DC | PRN

## 2017-03-17 MED ORDER — MIDAZOLAM HCL 2 MG/2ML IJ SOLN
INTRAMUSCULAR | Status: AC
  Filled 2017-03-17: qty 2

## 2017-03-17 SURGICAL SUPPLY — 29 items
CANISTER SUCT 3000ML (MISCELLANEOUS) ×3 IMPLANT
CATH KIT ON-Q SILVERSOAK 5IN (CATHETERS) IMPLANT
CLOSURE WOUND 1/2 X4 (GAUZE/BANDAGES/DRESSINGS) ×1
DRSG OPSITE POSTOP 4X10 (GAUZE/BANDAGES/DRESSINGS) ×3 IMPLANT
DRSG TELFA 3X8 NADH (GAUZE/BANDAGES/DRESSINGS) ×3 IMPLANT
ELECT CAUTERY BLADE 6.4 (BLADE) ×3 IMPLANT
ELECT REM PT RETURN 9FT ADLT (ELECTROSURGICAL) ×3
ELECTRODE REM PT RTRN 9FT ADLT (ELECTROSURGICAL) ×1 IMPLANT
GAUZE SPONGE 4X4 12PLY STRL (GAUZE/BANDAGES/DRESSINGS) ×3 IMPLANT
GLOVE BIO SURGEON STRL SZ7 (GLOVE) ×3 IMPLANT
GLOVE INDICATOR 7.5 STRL GRN (GLOVE) ×3 IMPLANT
GOWN STRL REUS W/ TWL LRG LVL3 (GOWN DISPOSABLE) ×3 IMPLANT
GOWN STRL REUS W/TWL LRG LVL3 (GOWN DISPOSABLE) ×6
LIQUID BAND (GAUZE/BANDAGES/DRESSINGS) ×3 IMPLANT
NS IRRIG 1000ML POUR BTL (IV SOLUTION) ×3 IMPLANT
PACK C SECTION AR (MISCELLANEOUS) ×3 IMPLANT
PAD OB MATERNITY 4.3X12.25 (PERSONAL CARE ITEMS) ×6 IMPLANT
PAD PREP 24X41 OB/GYN DISP (PERSONAL CARE ITEMS) ×3 IMPLANT
SPONGE LAP 18X18 5 PK (GAUZE/BANDAGES/DRESSINGS) ×6 IMPLANT
STRIP CLOSURE SKIN 1/2X4 (GAUZE/BANDAGES/DRESSINGS) ×2 IMPLANT
SUT CHROMIC GUT BROWN 0 54 (SUTURE) ×1 IMPLANT
SUT CHROMIC GUT BROWN 0 54IN (SUTURE) ×3
SUT MNCRL 4-0 (SUTURE) ×2
SUT MNCRL 4-0 27XMFL (SUTURE) ×1
SUT PDS AB 1 TP1 96 (SUTURE) ×3 IMPLANT
SUT PLAIN 2 0 XLH (SUTURE) IMPLANT
SUT VIC AB 0 CT1 36 (SUTURE) ×12 IMPLANT
SUTURE MNCRL 4-0 27XMF (SUTURE) ×1 IMPLANT
SWABSTK COMLB BENZOIN TINCTURE (MISCELLANEOUS) ×3 IMPLANT

## 2017-03-17 NOTE — Progress Notes (Addendum)
1145:Phone call back to ED, call from them had disconnected. They report 28 week patient with heavy bleeding, FHR less than 100 (60's-80's) per bedside US, patient report of decreased fetal movement.  Advised ED nurse that patient needs to be brought to birthplace immediately. Call to [facility] at 1149 advising them of potential stat c/s.  CNM Tresea MallJane Gledhill called/texted Dr. Jean RosenthalJackson, I paged at [phone], he was in house by 1201.  Dr. Feliberto GottronSchermerhorn in department, performed bedside US at 1203, FHR >100, patient prepped for stat CS, moved to OR at 1205.

## 2017-03-17 NOTE — ED Provider Notes (Addendum)
[facility] Emergency Department Provider Note       Time seen: ----------------------------------------- 11:15 AM on 03/17/2017 -----------------------------------------     I have reviewed the triage vital signs and the nursing notes.   HISTORY   Chief Complaint Vaginal Bleeding and Abdominal Pain    HPI [name] is a 31 y.o. female who presents to the ED for large vaginal bleeding and decreased fetal movement with abdominal pain. Patient states symptom onset was this morning, she last felt the baby move this morning. She has not had any vaginal bleeding prior to today. She is having some low back pain and feeling like she needs to push. She denies fevers, chills or other complaints. Reportedly she is around [redacted] weeks pregnant   Past Medical History:  Diagnosis Date  . Asthma   . Cervical cyst     Patient Active Problem List   Diagnosis Date Noted  . First trimester screening     No past surgical history on file.  Allergies Demerol [meperidine]  Social History Social History  Substance Use Topics  . Smoking status: Current Every Day Smoker    Packs/day: 0.25    Types: Cigarettes  . Smokeless tobacco: Never Used  . Alcohol use No    Review of Systems Constitutional: Negative for fever. Cardiovascular: Negative for chest pain. Respiratory: Negative for shortness of breath. Gastrointestinal: Positive for abdominal pain Genitourinary: Negative for dysuria. Positive vaginal bleeding Musculoskeletal: Positive for back pain Skin: Negative for rash. Neurological: Negative for headaches, positive for weakness  10-point ROS otherwise negative.  ____________________________________________   PHYSICAL EXAM:  VITAL SIGNS: ED Triage Vitals  Enc Vitals Group     BP --      Pulse --      Resp --      Temp --      Temp src --      SpO2 03/17/17 1112 100 %     Weight --      Height --      Head Circumference --      Peak Flow  --      Pain Score 03/17/17 1113 10     Pain Loc --      Pain Edu? --      Excl. in GC? --     Constitutional: Alert and oriented. Anxious, moderate distress Eyes: Conjunctivae are normal. PERRL. Normal extraocular movements. ENT   Head: Normocephalic and atraumatic.   Nose: No congestion/rhinnorhea.   Mouth/Throat: Mucous membranes are moist.   Neck: No stridor. Cardiovascular: Normal rate, regular rhythm. No murmurs, rubs, or gallops. Respiratory: Normal respiratory effort without tachypnea nor retractions. Breath sounds are clear and equal bilaterally. No wheezes/rales/rhonchi. Gastrointestinal: Gravid uterus, approximately 6-8 cm above the umbilicus, normal bowel sounds Genitourinary: Blood clots present, no active bleeding Musculoskeletal: Nontender with normal range of motion in extremities. No lower extremity tenderness nor edema. Neurologic:  Normal speech and language. No gross focal neurologic deficits are appreciated.  Skin:  Skin is warm, dry and intact. No rash noted. Psychiatric: Anxious mood and affect  ____________________________________________  ED COURSE:  Pertinent labs & imaging results that were available during my care of the patient were reviewed by me and considered in my medical decision making (see chart for details). Patient presents for vaginal bleeding, abdominal pain, we will assess with labs and imaging as indicated.  Bedside ultrasound confirms it uterine pregnancy, fetal heart rate 120-130 bpm Clinical Course as of Mar 17 1157  Mon  Mar 17, 2017  1155 Repeat ultrasound showed a fetal heart rate around 80 bpm. Concern for abruption, fetal distress. Patient will likely need emergent C-section.  [JW]    Clinical Course User Index [JW] Emily Filbert, MD   Procedures ____________________________________________   LABS (pertinent positives/negatives)  Labs Reviewed  CBC WITH DIFFERENTIAL/PLATELET - Abnormal; Notable for the  following:       Result Value   Hemoglobin 11.8 (*)    RDW 14.6 (*)    Neutro Abs 7.1 (*)    All other components within normal limits  COMPREHENSIVE METABOLIC PANEL - Abnormal; Notable for the following:    CO2 21 (*)    Calcium 8.4 (*)    Total Protein 6.4 (*)    Albumin 3.0 (*)    ALT 11 (*)    All other components within normal limits  LIPASE, BLOOD  HCG, QUANTITATIVE, PREGNANCY  TYPE AND SCREEN    RADIOLOGY  Ultrasound OB limited Is pending ____________________________________________  FINAL ASSESSMENT AND PLAN  Second trimester pregnancy, vaginal bleeding, abdominal pain  Plan: Patient's labs and imaging were dictated above. Patient had presented for abdominal pain and bleeding, questionable abruption. I will discuss with OB/GYN on-call.  Patient is being emergently sent to labor and delivery for C-section. I discussed with Dr. Jean Rosenthal on call. Currently the fetus is viable but heart rate is concerning for fetal distress. She is currently not hemorrhaging. Suspected abruption Emily Filbert, MD   Note: This note was generated in part or whole with voice recognition software. Voice recognition is usually quite accurate but there are transcription errors that can and very often do occur. I apologize for any typographical errors that were not detected and corrected.     Emily Filbert, MD 03/17/17 1130    Emily Filbert, MD 03/17/17 [phone]

## 2017-03-17 NOTE — OR Nursing (Signed)
Emergent C-Section without performing any instrument counts or time outs.  X-Ray was obtained and determined that no loose articles were found.

## 2017-03-17 NOTE — ED Notes (Signed)
Pt taken to L&D by myself and Tammy,EDT.

## 2017-03-17 NOTE — Anesthesia Post-op Follow-up Note (Cosign Needed)
Anesthesia QCDR form completed.        
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
## 2017-03-17 NOTE — ED Notes (Signed)
MD at bedside to assess FHT with US, noted to be approx 110.

## 2017-03-17 NOTE — ED Notes (Signed)
Pt verbalized that she is NOT 8 weeks but approx 28 weeks preg, MD made aware of difference.

## 2017-03-17 NOTE — Anesthesia Procedure Notes (Signed)
Procedure Name: Intubation Date/Time: 03/17/2017 12:13 PM Performed by: Jonna Clark Pre-anesthesia Checklist: Patient identified, Patient being monitored, Timeout performed, Emergency Drugs available and Suction available Patient Re-evaluated:Patient Re-evaluated prior to inductionOxygen Delivery Method: Circle system utilized Preoxygenation: Pre-oxygenation with 100% oxygen Intubation Type: IV induction, Rapid sequence and Cricoid Pressure applied Laryngoscope Size: Mac and 3 Grade View: Grade I Tube type: Oral Tube size: 7.0 mm Number of attempts: 1 Airway Equipment and Method: Stylet Placement Confirmation: ETT inserted through vocal cords under direct vision,  positive ETCO2 and breath sounds checked- equal and bilateral Secured at: 24 cm Tube secured with: Tape Dental Injury: Teeth and Oropharynx as per pre-operative assessment

## 2017-03-17 NOTE — Anesthesia Preprocedure Evaluation (Signed)
Anesthesia Evaluation  Patient identified by MRN, date of birth, ID band Patient awake  Preop documentation limited or incomplete due to emergent nature of procedure.  History of Anesthesia Complications Negative for: history of anesthetic complications  Airway Mallampati: II  TM Distance: >3 FB Neck ROM: full    Dental   Pulmonary asthma , Current Smoker,           Cardiovascular Exercise Tolerance: Good negative cardio ROS       Neuro/Psych    GI/Hepatic   Endo/Other    Renal/GU      Musculoskeletal   Abdominal   Peds  Hematology   Anesthesia Other Findings Marijuana use, but denies all other drugs.  Patient had a banana today for breakfast, so will proceed with RSI.  Past Medical History: No date: Asthma No date: Cervical cyst   Reproductive/Obstetrics (+) Pregnancy                             Anesthesia Physical Anesthesia Plan  ASA: II and emergent  Anesthesia Plan: General ETT, Rapid Sequence and Cricoid Pressure   Post-op Pain Management:    Induction:   Airway Management Planned:   Additional Equipment:   Intra-op Plan:   Post-operative Plan:   Informed Consent:   Only emergency history available  Plan Discussed with: Anesthesiologist, CRNA and Surgeon  Anesthesia Plan Comments:         Anesthesia Quick Evaluation

## 2017-03-17 NOTE — Transfer of Care (Signed)
Immediate Anesthesia Transfer of Care Note  Patient: [name]  Procedure(s) Performed: Procedure(s): CESAREAN SECTION (N/A)  Patient Location: PACU  Anesthesia Type:General  Level of Consciousness: awake, alert  and oriented  Airway & Oxygen Therapy: Patient Spontanous Breathing and Patient connected to face mask oxygen  Post-op Assessment: Report given to RN and Post -op Vital signs reviewed and stable  Post vital signs: Reviewed and stable  Last Vitals:  Vitals:   03/17/17 1130 03/17/17 1319  BP: 125/86 126/86  Pulse:  73  Resp:  17  Temp:  (!) 36.1 C    Last Pain:  Vitals:   03/17/17 1114  TempSrc: Axillary  PainSc:          Complications: No apparent anesthesia complications

## 2017-03-17 NOTE — ED Triage Notes (Addendum)
Pt from home via ems with reports from medic that pt is approx 8 weeks preg and had a large gush of vaginal blood around 1000 with decreased fetal movement and abd pain.

## 2017-03-17 NOTE — ED Notes (Signed)
L&D called with report about pt since Dr Chauncey CruelStabler had been paged 4 times without responding. L&D staff stated that Dr Chauncey Cruelstabler was not on call and that it was Dr Jean RosenthalJackson. Dr Alden HippJacksons midwife was near desk per staff and was going to Dr Mayford Knifewilliams. Call was disconnected.

## 2017-03-17 NOTE — ED Notes (Signed)
MD at bedside with US to assess FHT, noted to be approx 60-80 bpm. L&D called at same time and informed of decrease in FHT, instructed to bring pt to L&D.

## 2017-03-17 NOTE — Op Note (Signed)
Cesarean Section Operative Note    [name]   03/17/2017   Pre-operative Diagnosis:  1) intrauterine pregnancy at 2067w1d  2) placenta abruption.  3) non-reassuring fetal antepartum testing  Post-operative Diagnosis:  1) intrauterine pregnancy at 4567w1d  2) placenta abruption.  3) non-reassuring fetal antepartum testing  Procedure: Emergent Low Transverse Cesarean Delivery with double-layer uterine closure  Surgeon: Surgeon(s) and Role:    * Conard NovakStephen D Zeola Brys, MD - Primary   Anesthesia: general   Findings:  1) normal appearing gravid uterus, fallopian tubes, and ovaries 2) blood on entry to uterus   Estimated Blood Loss: 1,000 mL  Total IV Fluids: 1,000 ml   Specimens: placenta  Complications: no complications  Disposition: PACU - hemodynamically stable.   Maternal Condition: stable   Baby condition / location:  NICU  Procedure Details:  The patient was seen in the Holding Room. The risks, benefits, complications, treatment options, and expected outcomes were discussed with the patient. The patient concurred with the proposed plan, giving informed verbal consent. identified as Erica MidgetAshley Milham and the procedure verified as C-Section Delivery.   The patient was prepped and draped in the usual, sterile fashion with betadine.  After induction of general anesthesia, using Pelosi menauver the abdomen was entered. The bladder flap was not sharply freed from the lower uterine segment. A low transverse uterine incision was made and the hysterotomy was extended with cranial-caudal tension. Delivered from cephalic presentation was a viable infant with Apgar scores not yet available (NICU staff present). Cord ph was sent the umbilical cord was clamped and cut cord blood was obtained for evaluation. Of note, there was blood upon entry into the uterine cavity with several large clots seen.  The placenta was removed Intact and appeared normal. The uterine outline, tubes and ovaries  appeared normal. The uterine incision was closed with running locked sutures of 0 Vicryl.  A second layer of the same suture was thrown in an imbricating fashion.  Hemostasis was assured.  The uterus was returned to the abdomen and the paracolic gutters were cleared of all clots and debris.  The rectus muscles were inspected and found to be hemostatic.  The fascia was then reapproximated with running sutures of 1-0 PDS, looped. The skin closure was reapproximted by staples and a pressure dressing was applied.  Instrument, sponge, and needle counts were not able to be obtained prior to start of the case.  An intraoperative x-ray was performed and found to be clear of instruments, sponges, and needles.  The patient received Ancef 2 gram IV prior to skin incision (within 30 minutes). For VTE prophylaxis she was wearing SCDs throughout the case.  This surgery was performed emergently.    Signed: Conard NovakStephen D. Eliab Closson, MD 03/17/2017 1:15 PM

## 2017-03-18 ENCOUNTER — Encounter: Payer: Self-pay | Admitting: Obstetrics and Gynecology

## 2017-03-18 DIAGNOSIS — J45909 Unspecified asthma, uncomplicated: Secondary | ICD-10-CM

## 2017-03-18 DIAGNOSIS — D62 Acute posthemorrhagic anemia: Secondary | ICD-10-CM

## 2017-03-18 LAB — CBC
HCT: 19.5 % — ABNORMAL LOW (ref 35.0–47.0)
HEMATOCRIT: 26 % — AB (ref 35.0–47.0)
HEMOGLOBIN: 6.5 g/dL — AB (ref 12.0–16.0)
HEMOGLOBIN: 8.9 g/dL — AB (ref 12.0–16.0)
MCH: 28.6 pg (ref 26.0–34.0)
MCH: 29.4 pg (ref 26.0–34.0)
MCHC: 33.5 g/dL (ref 32.0–36.0)
MCHC: 34.4 g/dL (ref 32.0–36.0)
MCV: 85.3 fL (ref 80.0–100.0)
MCV: 85.4 fL (ref 80.0–100.0)
PLATELETS: 97 10*3/uL — AB (ref 150–440)
Platelets: 98 10*3/uL — ABNORMAL LOW (ref 150–440)
RBC: 2.28 MIL/uL — ABNORMAL LOW (ref 3.80–5.20)
RBC: 3.04 MIL/uL — ABNORMAL LOW (ref 3.80–5.20)
RDW: 14.2 % (ref 11.5–14.5)
RDW: 14.8 % — AB (ref 11.5–14.5)
WBC: 10.9 10*3/uL (ref 3.6–11.0)
WBC: 11 10*3/uL (ref 3.6–11.0)

## 2017-03-18 LAB — ABO/RH: ABO/RH(D): O POS

## 2017-03-18 LAB — PREPARE RBC (CROSSMATCH)

## 2017-03-18 LAB — RPR: RPR Ser Ql: NONREACTIVE

## 2017-03-18 MED ORDER — SODIUM CHLORIDE 0.9 % IV SOLN: Freq: Once | INTRAVENOUS | Status: DC

## 2017-03-18 MED ORDER — LACTATED RINGERS IV BOLUS (SEPSIS)
300.0000 mL | Freq: Once | INTRAVENOUS | Status: AC
  Administered 2017-03-18: 300 mL via INTRAVENOUS

## 2017-03-18 MED ORDER — IBUPROFEN 400 MG PO TABS: 400.0000 mg | ORAL_TABLET | Freq: Four times a day (QID) | ORAL | Status: DC

## 2017-03-18 MED ORDER — IBUPROFEN 600 MG PO TABS
600.0000 mg | ORAL_TABLET | Freq: Four times a day (QID) | ORAL | Status: DC
  Administered 2017-03-18 (×2): 600 mg via ORAL
  Filled 2017-03-18: qty 1

## 2017-03-18 MED ORDER — DIPHENHYDRAMINE HCL 25 MG PO CAPS
25.0000 mg | ORAL_CAPSULE | Freq: Once | ORAL | Status: AC
  Administered 2017-03-18: 25 mg via ORAL
  Filled 2017-03-18: qty 1

## 2017-03-18 MED ORDER — FUROSEMIDE 10 MG/ML IJ SOLN
20.0000 mg | Freq: Once | INTRAMUSCULAR | Status: AC
  Administered 2017-03-18: 20 mg via INTRAVENOUS
  Filled 2017-03-18: qty 2

## 2017-03-18 MED ORDER — ACETAMINOPHEN 325 MG PO TABS
650.0000 mg | ORAL_TABLET | Freq: Once | ORAL | Status: AC
  Administered 2017-03-18: 650 mg via ORAL
  Filled 2017-03-18: qty 2

## 2017-03-18 MED ORDER — KETOROLAC TROMETHAMINE 30 MG/ML IJ SOLN
30.0000 mg | Freq: Four times a day (QID) | INTRAMUSCULAR | Status: DC
  Administered 2017-03-18: 30 mg via INTRAVENOUS
  Filled 2017-03-18: qty 1
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
## 2017-03-18 NOTE — Anesthesia Post-op Follow-up Note (Signed)
  Anesthesia Pain Follow-up Note  Patient: [name]  Day #: 1  Date of Follow-up: 03/18/2017 Time: 8:01 AM  Last Vitals:  Vitals:   03/18/17 0400 03/18/17 0757  BP: 123/63 (!) 123/59  Pulse: 60 62  Resp: 18 18  Temp: 36.7 C 36.7 C    Level of Consciousness: alert  Pain: none   Side Effects:None  Catheter Site Exam:clean, dry, no drainage     Plan: Continue current therapy of postop epidural at surgeon's request  Bobbye Reinitz,  Sheran FavaMark R

## 2017-03-18 NOTE — Anesthesia Postprocedure Evaluation (Signed)
Anesthesia Post Note  Patient: [name]  Procedure(s) Performed: Procedure(s) (LRB): CESAREAN SECTION (N/A)  Patient location during evaluation: Mother Baby Anesthesia Type: General Level of consciousness: awake, awake and alert and oriented Pain management: pain level controlled Vital Signs Assessment: post-procedure vital signs reviewed and stable Respiratory status: spontaneous breathing, nonlabored ventilation and respiratory function stable Cardiovascular status: stable Anesthetic complications: no     Last Vitals:  Vitals:   03/18/17 0400 03/18/17 0757  BP: 123/63 (!) 123/59  Pulse: 60 62  Resp: 18 18  Temp: 36.7 C 36.7 C    Last Pain:  Vitals:   03/18/17 0757  TempSrc: Oral  PainSc:                  Lyn RecordsNoles,  [name]

## 2017-03-18 NOTE — Progress Notes (Signed)
Admit Date: 03/17/2017 Today's Date: 03/18/2017  Subjective: Postpartum Day 1: Cesarean Delivery at 29 weeks for abruption Patient reports incisional pain and lower abd pain.  Also feels gas pain in epigastrium and chest. Feels weak and tired and dizzy.   Desires to be close to baby ([facility], stable).  Objective: Vital signs in last 24 hours: Temp:  [96.9 F (36.1 C)-98.6 F (37 C)] 98 F (36.7 C) (03/27 0400) Pulse Rate:  [55-131] 60 (03/27 0400) Resp:  [11-26] 18 (03/27 0400) BP: (98-169)/(56-100) 123/63 (03/27 0400) SpO2:  [98 %-100 %] 98 % (03/27 0400) Weight:  [149 lb (67.6 kg)] 149 lb (67.6 kg) (03/26 1115)  Physical Exam:  General: cooperative, fatigued and no distress Lochia: appropriate Uterine Fundus: firm Incision: dressing clean and dry DVT Evaluation: No evidence of DVT seen on physical exam. Negative Homan's sign.   Recent Labs  03/17/17 1114 03/18/17 0427  HGB 11.8* 6.5*  HCT 35.3 19.5*    Assessment/Plan: Status post Cesarean section. 29 weeks.  Abruption. She has low blood counts following surgery for abruption, along with symptoms of anemia.  Rec blood transfusion; explained risks and benefits to patient.  2 U.  Ambulate later.  Keep foley for now. Diet OK. Consider transfer once stable enough after blood transfusion. Adjust analgesia due to continued c/o pain.  Letitia Libraobert Paul Beaudry 03/18/2017, 6:42 AM

## 2017-03-19 DIAGNOSIS — J4520 Mild intermittent asthma, uncomplicated: Secondary | ICD-10-CM | POA: Diagnosis present

## 2017-03-19 LAB — BPAM RBC
Blood Product Expiration Date: 201804092359
Blood Product Expiration Date: 201804092359
ISSUE DATE / TIME: 201803270912
ISSUE DATE / TIME: 201803271157
UNIT TYPE AND RH: 5100
Unit Type and Rh: 5100

## 2017-03-19 LAB — TYPE AND SCREEN
ABO/RH(D): O POS
Antibody Screen: NEGATIVE
UNIT DIVISION: 0
UNIT DIVISION: 0

## 2017-03-19 MED ORDER — IBUPROFEN 600 MG PO TABS: 600.0000 mg | ORAL_TABLET | Freq: Four times a day (QID) | ORAL | 0 refills | Status: DC | PRN

## 2017-03-19 MED ORDER — ALBUTEROL SULFATE HFA 108 (90 BASE) MCG/ACT IN AERS: 2.0000 | INHALATION_SPRAY | Freq: Four times a day (QID) | RESPIRATORY_TRACT | 2 refills | Status: DC | PRN

## 2017-03-19 MED ORDER — OXYCODONE-ACETAMINOPHEN 5-325 MG PO TABS: 2.0000 | ORAL_TABLET | Freq: Four times a day (QID) | ORAL | 0 refills | Status: DC | PRN

## 2017-03-19 MED ORDER — IBUPROFEN 600 MG PO TABS
600.0000 mg | ORAL_TABLET | Freq: Four times a day (QID) | ORAL | Status: DC | PRN
  Administered 2017-03-19: 600 mg via ORAL
  Filled 2017-03-19: qty 1
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
## 2017-03-19 NOTE — Discharge Instructions (Signed)
Follow up sooner with fever greater than 100.5, problems breathing, pain not helped by medications, severe depression ( more than just baby blues, wanting to hurt yourself or the baby), severe bleeding( saturating more than one pad an hour or large palm sized clots),or any incisional concerns such as increased redness, swelling, discharge or increased pain in incision.  No heavy lifting for 6 weeks.  No driving while taking narcotics. No douches, intercourse, tampons, or enemas for 6 weeks.  °
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
## 2017-03-19 NOTE — Progress Notes (Signed)
All discharge instructions given to patient and she voices understanding of all instructions given f/u appt made .prescription given. Iv d/c'd , pt tolerated well, site benign.

## 2017-03-24 ENCOUNTER — Telehealth: Payer: Self-pay

## 2017-03-24 NOTE — Telephone Encounter (Signed)
Pt had c-section 03/17/17 with SDJ. c/o pain, coughing, constipation, pain with standing and feeling lightheaded. Pt scheduled for post op on 03/26/17 8:30 with SDJ. Advised we could not refill percocet until she is seen by a provider. Encouraged fluid intake and fibrous diet along with stool softeners to help with bowel movements. Pt denies redness, swelling or oozing at the incision site and will have staples removed at appt on Wednesday.

## 2017-03-26 ENCOUNTER — Encounter: Payer: Medicaid Other | Admitting: Obstetrics and Gynecology

## 2017-03-28 ENCOUNTER — Encounter: Payer: Self-pay | Admitting: Emergency Medicine

## 2017-03-28 ENCOUNTER — Emergency Department
Admission: EM | Admit: 2017-03-28 | Discharge: 2017-03-29 | Disposition: A | Discharge status: Home / Self Care | Payer: Medicaid Other | Attending: Emergency Medicine | Admitting: Emergency Medicine

## 2017-03-28 DIAGNOSIS — IMO0001 Reserved for inherently not codable concepts without codable children: Secondary | ICD-10-CM

## 2017-03-28 DIAGNOSIS — R1030 Lower abdominal pain, unspecified: Secondary | ICD-10-CM | POA: Insufficient documentation

## 2017-03-28 DIAGNOSIS — B9689 Other specified bacterial agents as the cause of diseases classified elsewhere: Secondary | ICD-10-CM | POA: Insufficient documentation

## 2017-03-28 DIAGNOSIS — F17210 Nicotine dependence, cigarettes, uncomplicated: Secondary | ICD-10-CM | POA: Diagnosis not present

## 2017-03-28 DIAGNOSIS — O860 Infection of obstetric surgical wound: Secondary | ICD-10-CM | POA: Diagnosis not present

## 2017-03-28 DIAGNOSIS — J45909 Unspecified asthma, uncomplicated: Secondary | ICD-10-CM | POA: Insufficient documentation

## 2017-03-28 DIAGNOSIS — T814XXA Infection following a procedure, initial encounter: Secondary | ICD-10-CM

## 2017-03-28 NOTE — ED Triage Notes (Signed)
Pt to triage via [facility], report emergency c-section 3/26, reports supposed to get staples on out Wednesday, however tonight increased pain, reports yellow drainage as well, and states incision feels hard.  Pt NAD at this time.

## 2017-03-29 ENCOUNTER — Emergency Department: Payer: Medicaid Other

## 2017-03-29 LAB — CBC
HCT: 27.5 % — ABNORMAL LOW (ref 35.0–47.0)
Hemoglobin: 9 g/dL — ABNORMAL LOW (ref 12.0–16.0)
MCH: 28.4 pg (ref 26.0–34.0)
MCHC: 32.8 g/dL (ref 32.0–36.0)
MCV: 86.6 fL (ref 80.0–100.0)
PLATELETS: 215 10*3/uL (ref 150–440)
RBC: 3.17 MIL/uL — AB (ref 3.80–5.20)
RDW: 15.5 % — AB (ref 11.5–14.5)
WBC: 7.9 10*3/uL (ref 3.6–11.0)

## 2017-03-29 LAB — COMPREHENSIVE METABOLIC PANEL
ALT: 14 U/L (ref 14–54)
AST: 24 U/L (ref 15–41)
Albumin: 3.2 g/dL — ABNORMAL LOW (ref 3.5–5.0)
Alkaline Phosphatase: 55 U/L (ref 38–126)
Anion gap: 3 — ABNORMAL LOW (ref 5–15)
BUN: 12 mg/dL (ref 6–20)
CALCIUM: 8.6 mg/dL — AB (ref 8.9–10.3)
CO2: 22 mmol/L (ref 22–32)
CREATININE: 0.65 mg/dL (ref 0.44–1.00)
Chloride: 115 mmol/L — ABNORMAL HIGH (ref 101–111)
GFR calc Af Amer: >60 mL/min (ref >60)
GFR calc non Af Amer: >60 mL/min (ref >60)
Glucose, Bld: 95 mg/dL (ref 65–99)
Potassium: 3.8 mmol/L (ref 3.5–5.1)
Sodium: 140 mmol/L (ref 135–145)
Total Bilirubin: 0.4 mg/dL (ref 0.3–1.2)
Total Protein: 6.1 g/dL — ABNORMAL LOW (ref 6.5–8.1)

## 2017-03-29 MED ORDER — MORPHINE SULFATE (PF) 2 MG/ML IV SOLN: 2.0000 mg | Freq: Once | INTRAVENOUS | Status: DC

## 2017-03-29 MED ORDER — ONDANSETRON HCL 4 MG/2ML IJ SOLN
INTRAMUSCULAR | Status: AC
  Administered 2017-03-29: 4 mg via INTRAVENOUS
  Filled 2017-03-29: qty 2

## 2017-03-29 MED ORDER — ONDANSETRON HCL 4 MG/2ML IJ SOLN
4.0000 mg | Freq: Once | INTRAMUSCULAR | Status: DC
Start: 2017-03-29 — End: 2017-03-29

## 2017-03-29 MED ORDER — IOPAMIDOL (ISOVUE-300) INJECTION 61%
100.0000 mL | Freq: Once | INTRAVENOUS | Status: AC | PRN
  Administered 2017-03-29: 100 mL via INTRAVENOUS

## 2017-03-29 MED ORDER — MORPHINE SULFATE (PF) 2 MG/ML IV SOLN
INTRAVENOUS | Status: AC
  Administered 2017-03-29: 2 mg via INTRAVENOUS
  Filled 2017-03-29: qty 1

## 2017-03-29 MED ORDER — CEPHALEXIN 500 MG PO CAPS: 500.0000 mg | ORAL_CAPSULE | Freq: Two times a day (BID) | ORAL | 0 refills | Status: AC

## 2017-03-29 MED ORDER — MORPHINE SULFATE (PF) 2 MG/ML IV SOLN
2.0000 mg | Freq: Once | INTRAVENOUS | Status: AC
  Administered 2017-03-29: 2 mg via INTRAVENOUS

## 2017-03-29 MED ORDER — ONDANSETRON HCL 4 MG/2ML IJ SOLN
4.0000 mg | Freq: Once | INTRAMUSCULAR | Status: AC
  Administered 2017-03-29: 4 mg via INTRAVENOUS

## 2017-03-29 MED ORDER — CEPHALEXIN 500 MG PO CAPS
500.0000 mg | ORAL_CAPSULE | Freq: Once | ORAL | Status: AC
  Administered 2017-03-29: 500 mg via ORAL
  Filled 2017-03-29: qty 1

## 2017-03-29 NOTE — ED Provider Notes (Addendum)
[facility] Emergency Department Provider Note    First MD Initiated Contact with Patient 03/28/17 2335     (approximate)  I have reviewed the triage vital signs and the nursing notes.   HISTORY  Chief Complaint Post-op Problem    HPI [name] is a 31 y.o. female with history of emergency C-section on 03/17/2017 secondary to placenta abruption presents to the emergency department with purulent drainage from surgical wound site staple line. Patient denies any fever however does admit to chills night sweats in pain predominantly in the right side of the surgical wound. Patient states her current pain score is 8 out of 10  Past Medical History:  Diagnosis Date  . Asthma   . Cervical cyst     Patient Active Problem List   Diagnosis Date Noted  . Mild intermittent asthma 03/19/2017  . Pregnancy, supervision, high-risk, third trimester 03/17/2017  . Abruptio placenta, third trimester 03/17/2017  . [redacted] weeks gestation of pregnancy 03/17/2017  . Marijuana use 03/17/2017    Past Surgical History:  Procedure Laterality Date  . CESAREAN SECTION N/A 03/17/2017   Procedure: CESAREAN SECTION;  Surgeon: Conard Novak, MD;  Location: [facility] ORS;  Service: Obstetrics;  Laterality: N/A;    Prior to Admission medications   Medication Sig Start Date End Date Taking? Authorizing Provider  albuterol (PROVENTIL HFA;VENTOLIN HFA) 108 (90 Base) MCG/ACT inhaler Inhale 2 puffs into the lungs every 6 (six) hours as needed for wheezing or shortness of breath. 03/19/17   Conard Novak, MD  ibuprofen (ADVIL,MOTRIN) 600 MG tablet Take 1 tablet (600 mg total) by mouth every 6 (six) hours as needed for mild pain or moderate pain. 03/19/17   Conard Novak, MD  oxyCODONE-acetaminophen (PERCOCET/ROXICET) 5-325 MG tablet Take 2 tablets by mouth every 6 (six) hours as needed (breakthrough pain). 03/19/17   Conard Novak, MD    Allergies Demerol [meperidine]  History  reviewed. No pertinent family history.  Social History Social History  Substance Use Topics  . Smoking status: Current Every Day Smoker    Packs/day: 0.25    Types: Cigarettes  . Smokeless tobacco: Never Used  . Alcohol use No    Review of Systems Constitutional: No fever/chills Eyes: No visual changes. ENT: No sore throat. Cardiovascular: Denies chest pain. Respiratory: Denies shortness of breath. Gastrointestinal: No abdominal pain.  No nausea, no vomiting.  No diarrhea.  No constipation. Genitourinary: Negative for dysuria. Musculoskeletal: Negative for back pain. Skin: Negative for rash. Neurological: Negative for headaches, focal weakness or numbness. Psychiatric:Positive for anxiety  10-point ROS otherwise negative.  ____________________________________________   PHYSICAL EXAM:  VITAL SIGNS: ED Triage Vitals [03/28/17 2330]  Enc Vitals Group     BP (!) 157/95     Pulse Rate 60     Resp 16     Temp 98.4 F (36.9 C)     Temp Source Oral     SpO2 100 %     Weight 136 lb (61.7 kg)     Height  (1.702 m)     Head Circumference      Peak Flow      Pain Score 8     Pain Loc      Pain Edu?      Excl. in GC?     Constitutional: Alert and oriented. Well appearing and in no acute distress. Eyes: Conjunctivae are normal. PERRL. EOMI. Head: Atraumatic. Mouth/Throat: Mucous membranes are moist.  Oropharynx non-erythematous. Neck:  No stridor.   Cardiovascular: Normal rate, regular rhythm. Good peripheral circulation. Grossly normal heart sounds. Respiratory: Normal respiratory effort.  No retractions. Lungs CTAB. Gastrointestinal: Tender palpation along surgical wound site with scant purulent drainage noted at staple line on the right.. No distention.  Musculoskeletal: No lower extremity tenderness nor edema. No gross deformities of extremities. Neurologic:  Normal speech and language. No gross focal neurologic deficits are appreciated.  Skin:  Skin is warm,  dry and intact. No rash noted. Psychiatric: Anxious affect. Speech and behavior are normal.  ____________________________________________   LABS (all labs ordered are listed, but only abnormal results are displayed)  Labs Reviewed  CBC - Abnormal; Notable for the following:       Result Value   RBC 3.17 (*)    Hemoglobin 9.0 (*)    HCT 27.5 (*)    RDW 15.5 (*)    All other components within normal limits  COMPREHENSIVE METABOLIC PANEL - Abnormal; Notable for the following:    Chloride 115 (*)    Calcium 8.6 (*)    Total Protein 6.1 (*)    Albumin 3.2 (*)    Anion gap 3 (*)    All other components within normal limits  CULTURE, BLOOD (ROUTINE X 2)  CULTURE, BLOOD (ROUTINE X 2)     RADIOLOGY I, Piatt N Kharson Rasmusson, personally viewed and evaluated these images (plain radiographs) as part of my medical decision making, as well as reviewing the written report by the radiologist.  Ct Abdomen Pelvis W Contrast  Result Date: 03/29/2017 CLINICAL DATA:  Emergency Caesarean section performed March 17, 2017. Increased pain and yellow drainage with from incision site. Incision feels hard. Rule out abscess. EXAM: CT ABDOMEN AND PELVIS WITH CONTRAST TECHNIQUE: Multidetector CT imaging of the abdomen and pelvis was performed using the standard protocol following bolus administration of intravenous contrast. CONTRAST:  ISOVUE-300 IOPAMIDOL (ISOVUE-300) INJECTION 61% COMPARISON:  None. FINDINGS: Lower chest: Normal size cardiac chambers. No pericardial effusion. Clear lung bases. Hepatobiliary: Variable sized hypodensities consistent statistically with cysts and/or hemangiomata. No biliary dilatation. Gallbladder is nondistended. Pancreas: Unremarkable. No pancreatic ductal dilatation or surrounding inflammatory changes. Spleen: Normal in size without focal abnormality. Adrenals/Urinary Tract: Adrenal glands are unremarkable. Kidneys are normal, without renal calculi, focal lesion, or  hydronephrosis. Bladder is unremarkable. Stomach/Bowel: Stomach is within normal limits. Appendix appears normal. No evidence of bowel wall thickening, distention, or inflammatory changes. Vascular/Lymphatic: No significant vascular findings are present. No enlarged abdominal or pelvic lymph nodes. Reproductive: Postpartum enlargement of the uterus. No periuterine abscess identified. Transversely oriented skin staples from prior Cesarean incision are identified with trace postop subcutaneous fluid/seroma without abnormal enhancement. Subtle linear, transversely oriented hyperdensity along the ventral aspect of the rectus muscles, seen deep to the skin staples are likely to represent suture material, less likely blood. No significant fluid collection is identified. No abscess is seen. No intramuscular hemorrhage nor evidence of pyomyositis. Other: No abdominal wall hernia or abnormality. No abdominopelvic ascites. Musculoskeletal: No acute or significant osseous findings. IMPRESSION: Status post Cesarean section without evidence of abscess. Thin rim of subcutaneous low-density fluid deep to the Cesarean incision consistent with postop seroma. No drainable collections are noted. Variable sized cysts and/or hemangiomata of the liver. Electronically Signed   By: Tollie Eth M.D.   On: 03/29/2017 01:43     .Suture Removal Date/Time: 03/29/2017 2:43 AM Performed by: Darci Current Authorized by: Darci Current   Consent:    Consent obtained:  Verbal  Consent given by:  Patient   Risks discussed:  Pain, wound separation and bleeding   Alternatives discussed:  No treatment Location:    Location:  Trunk   Trunk location:  Abdomen Procedure details:    Wound appearance:  Purulent and tender   Number of staples removed:  20 Post-procedure details:    Post-removal:  Antibiotic ointment applied   Patient tolerance of procedure:  Tolerated well, no immediate  complications     ____________________________________________   INITIAL IMPRESSION / ASSESSMENT AND PLAN / ED COURSE  Pertinent labs & imaging results that were available during my care of the patient were reviewed by me and considered in my medical decision making (see chart for details).  31 year old female presenting with pain at surgical incision site with purulent drainage. CT scan of the abdomen pelvis with revealed no abscess or fluid collection. Suspect possible staple abscess. Patient discussed with Dr. Chauncey Cruel OB/GYN on-call who requested that the staples be removed. Upon removal of the staples small purulent drainage noted removal of multiple staples.     ____________________________________________  FINAL CLINICAL IMPRESSION(S) / ED DIAGNOSES  Final diagnoses:  Infection involving stitch with abscess, initial encounter     MEDICATIONS GIVEN DURING THIS VISIT:  Medications  morphine 2 MG/ML injection 2 mg (2 mg Intravenous Not Given 03/29/17 0005)  ondansetron (ZOFRAN) injection 4 mg (4 mg Intravenous Not Given 03/29/17 0015)  cephALEXin (KEFLEX) capsule 500 mg (not administered)  morphine 2 MG/ML injection 2 mg (2 mg Intravenous Given 03/29/17 0003)  ondansetron (ZOFRAN) injection 4 mg (4 mg Intravenous Given 03/29/17 0003)  iopamidol (ISOVUE-300) 61 % injection 100 mL (100 mLs Intravenous Contrast Given 03/29/17 0116)     NEW OUTPATIENT MEDICATIONS STARTED DURING THIS VISIT:  New Prescriptions   No medications on file    Modified Medications   No medications on file    Discontinued Medications   No medications on file     Note:  This document was prepared using Dragon voice recognition software and may include unintentional dictation errors.     Darci Current, MD 03/29/17 1610    Darci Current, MD 03/29/17 [phone]

## 2017-03-29 NOTE — ED Notes (Signed)
Blood culture draw #1: Rt wrist Blood culture draw #2: Rt hand

## 2017-03-31 ENCOUNTER — Ambulatory Visit (INDEPENDENT_AMBULATORY_CARE_PROVIDER_SITE_OTHER): Payer: Medicaid Other | Admitting: Obstetrics and Gynecology

## 2017-03-31 ENCOUNTER — Encounter: Payer: Self-pay | Admitting: Obstetrics and Gynecology

## 2017-03-31 VITALS — BP 124/74 | Ht 67.0 in | Wt 138.0 lb

## 2017-03-31 DIAGNOSIS — Z09 Encounter for follow-up examination after completed treatment for conditions other than malignant neoplasm: Secondary | ICD-10-CM

## 2017-03-31 NOTE — Progress Notes (Signed)
   Postoperative Follow-up Patient presents post op from emergency cesarean delivery on 03/17/17 for acute placental abruption.  Subjective: She was seen in the ER on 4/7 with suspected infection of staple sites (staples not removed when recommended to patient (3-4 days post-op or at 1 week post-op visit).   Eating a regular diet without difficulty. The patient has intermittent pain.  Activity: normal activities of daily living. She was seen in the ER two days ago and was diagnosed as having an infection at the staple site. She was given bactrim for this and she has been taking it. Denies fevers, chills, nausea, vomiting. Is able to void and have bowel movements without difficulty.   Objective: Vitals:   03/31/17 1012  BP: 124/74   Vital Signs: BP 124/74   Ht  (1.702 m)   Wt 138 lb (62.6 kg)   BMI 21.61 kg/m  Constitutional: Well nourished, well developed female in no acute distress.  HEENT: normal Skin: Warm and dry.  Extremity: no edema  Abdomen: Soft, non-tender, normal bowel sounds; no bruits, organomegaly or masses. clean, dry, intact.  At a staple site superior to the incision there is a small amount of prurulent drainage, but no induration, warmth, erythema.  There is mild ttp along the right aspect of the incision    Assessment: 31 y.o. s/p emergency c-section for acute placenta abruption  She had a mild infection due to the staples remaining in place too long. She had a negative abdominal/pelvic CT two days ago.  She seems to be healing well from this based on today's exam.  Continue with abx.  Plan: Continue with abx.   Wound care instructions given. Note for work given. Precautions given.  Activity plan: May return to work in 14 days, if continues to heal.  Follow up 4 weeks for 6 week postpartum visit.   Thomasene Mohair, MD 03/31/2017, 10:40 AM

## 2017-04-03 LAB — CULTURE, BLOOD (ROUTINE X 2)
CULTURE: NO GROWTH
CULTURE: NO GROWTH
SPECIAL REQUESTS: ADEQUATE

## 2017-04-17 ENCOUNTER — Encounter: Payer: Self-pay | Admitting: Emergency Medicine

## 2017-04-17 ENCOUNTER — Emergency Department
Admission: EM | Admit: 2017-04-17 | Discharge: 2017-04-17 | Disposition: A | Discharge status: Home / Self Care | Payer: Medicaid Other | Attending: Emergency Medicine | Admitting: Emergency Medicine

## 2017-04-17 DIAGNOSIS — W268XXA Contact with other sharp object(s), not elsewhere classified, initial encounter: Secondary | ICD-10-CM | POA: Insufficient documentation

## 2017-04-17 DIAGNOSIS — S61412A Laceration without foreign body of left hand, initial encounter: Secondary | ICD-10-CM | POA: Diagnosis not present

## 2017-04-17 DIAGNOSIS — Y929 Unspecified place or not applicable: Secondary | ICD-10-CM | POA: Diagnosis not present

## 2017-04-17 DIAGNOSIS — J45909 Unspecified asthma, uncomplicated: Secondary | ICD-10-CM | POA: Diagnosis not present

## 2017-04-17 DIAGNOSIS — F17210 Nicotine dependence, cigarettes, uncomplicated: Secondary | ICD-10-CM | POA: Insufficient documentation

## 2017-04-17 DIAGNOSIS — Y939 Activity, unspecified: Secondary | ICD-10-CM | POA: Diagnosis not present

## 2017-04-17 DIAGNOSIS — Y999 Unspecified external cause status: Secondary | ICD-10-CM | POA: Diagnosis not present

## 2017-04-17 DIAGNOSIS — S61412D Laceration without foreign body of left hand, subsequent encounter: Secondary | ICD-10-CM

## 2017-04-17 DIAGNOSIS — Z79899 Other long term (current) drug therapy: Secondary | ICD-10-CM | POA: Insufficient documentation

## 2017-04-17 MED ORDER — LIDOCAINE HCL (PF) 1 % IJ SOLN
INTRAMUSCULAR | Status: AC
  Administered 2017-04-17: 21:00:00
  Filled 2017-04-17: qty 5

## 2017-04-17 NOTE — ED Notes (Signed)
Patient was sliding down hill and hand hit a can lid. Tetanus about 5 years ago. Patient is very nervous and anxious. Dressing applied from triage. This RN did not remove bandage. Bandage is clean and dry.

## 2017-04-17 NOTE — ED Triage Notes (Signed)
Pt reports she was sliding down a hill and cut her left hand on a silly string can. On assessment there is approximately a inch laceration to the left inside of hand, bleeding controled at this time.

## 2017-04-17 NOTE — ED Provider Notes (Signed)
[facility] Emergency Department Provider Note   ____________________________________________   I have reviewed the triage vital signs and the nursing notes.   HISTORY  Chief Complaint Laceration    HPI [name] is a 31 y.o. female presents with laceration of the left hand along the palmar aspect at the base of the first digit. She describes sliding down a hill and hitting her hand on a metal can lid sustaining the laceration. She reports maintaining movement and sensation in the hand immediately following the injury. Pt denies any other injury.    Past Medical History:  Diagnosis Date  . Asthma   . Cervical cyst     Patient Active Problem List   Diagnosis Date Noted  . Mild intermittent asthma 03/19/2017  . Pregnancy, supervision, high-risk, third trimester 03/17/2017  . Abruptio placenta, third trimester 03/17/2017  . [redacted] weeks gestation of pregnancy 03/17/2017  . Marijuana use 03/17/2017    Past Surgical History:  Procedure Laterality Date  . CESAREAN SECTION N/A 03/17/2017   Procedure: CESAREAN SECTION;  Surgeon: Conard Novak, MD;  Location: [facility] ORS;  Service: Obstetrics;  Laterality: N/A;    Prior to Admission medications   Medication Sig Start Date End Date Taking? Authorizing Provider  albuterol (PROVENTIL HFA;VENTOLIN HFA) 108 (90 Base) MCG/ACT inhaler Inhale 2 puffs into the lungs every 6 (six) hours as needed for wheezing or shortness of breath. 03/19/17   Conard Novak, MD  ibuprofen (ADVIL,MOTRIN) 600 MG tablet Take 1 tablet (600 mg total) by mouth every 6 (six) hours as needed for mild pain or moderate pain. 03/19/17   Conard Novak, MD  oxyCODONE-acetaminophen (PERCOCET/ROXICET) 5-325 MG tablet Take 2 tablets by mouth every 6 (six) hours as needed (breakthrough pain). Patient not taking: Reported on 03/31/2017 03/19/17   Conard Novak, MD    Allergies Demerol [meperidine]  History reviewed. No pertinent family  history.  Social History Social History  Substance Use Topics  . Smoking status: Current Every Day Smoker    Packs/day: 0.25    Types: Cigarettes  . Smokeless tobacco: Never Used  . Alcohol use No    Review of Systems Constitutional: No fever/chills Eyes: No visual changes. ENT: No sore throat. Cardiovascular: Denies chest pain. Respiratory: Denies cough Gastrointestinal: No abdominal pain.  No nausea, no vomiting.   Genitourinary: Negative for dysuria. Musculoskeletal: Negative for back pain. Skin: Negative for rash. Left hand laceration, palmar aspect Neurological: Negative for headaches  ____________________________________________   PHYSICAL EXAM:  VITAL SIGNS: ED Triage Vitals  Enc Vitals Group     BP 04/17/17 1927 125/80     Pulse Rate 04/17/17 1920 81     Resp 04/17/17 1920 16     Temp 04/17/17 1920 98.8 F (37.1 C)     Temp Source 04/17/17 1920 Oral     SpO2 04/17/17 1920 100 %     Weight 04/17/17 1920 135 lb (61.2 kg)     Height --      Head Circumference --      Peak Flow --      Pain Score --      Pain Loc --      Pain Edu? --      Excl. in GC? --     Constitutional: Alert and oriented. Well appearing and in no acute distress..  Cardiovascular: Normal rate, regular rhythm.  Good peripheral circulation. Respiratory: Normal respiratory effort.  No retractions.  Genitourinary: deferred Musculoskeletal: No lower extremity  tenderness nor edema.  No joint effusions. Neurologic:  Normal speech and language. No gross focal neurologic deficits are appreciated.  Skin:  Skin is warm, dry and intact. Except left hand palmar aspect, at base of first digit 1 cm avulsion laceration.  No rash noted. Psychiatric: Mood and affect are normal. Speech and behavior are normal.  ____________________________________________   LABS (all labs ordered are listed, but only abnormal results are displayed)  Labs Reviewed - No data to  display ____________________________________________  EKG none  ____________________________________________  RADIOLOGY none ____________________________________________   PROCEDURES  Procedure(s) performed: LACERATION REPAIR Performed by: Clois Comber Authorized by: Clois Comber Consent: Verbal consent obtained. Risks and benefits: risks, benefits and alternatives were discussed Consent given by: patient Patient identity confirmed: provided demographic data Prepped and Draped in normal sterile fashion Wound explored  Laceration Location: Left hand, palmar aspect at base of first digit  Laceration Length: 1 cm avulsion  No Foreign Bodies seen or palpated  Anesthesia: local infiltration  Local anesthetic: lidocaine 1%  Anesthetic total: 2 ml  Irrigation method: syringe Amount of cleaning: standard  Skin closure: 4-0 ethilon sutures  Number of sutures: 3 sutures  Technique: simple interupted  Patient tolerance: Patient tolerated the procedure well with no immediate complications.    Critical Care performed: no ____________________________________________   INITIAL IMPRESSION / ASSESSMENT AND PLAN / ED COURSE  Pertinent labs & imaging results that were available during my care of the patient were reviewed by me and considered in my medical decision making (see chart for details).  Patient sustained a laceration left hand palmar aspect at base of first digit.  Assessment confirmed movement and sensation of the digit before and after wound closure. Laceration required suture closure as noted above. Patient tolerated procedure well. Pt instructed to keep wound clean and dry and will return to the emergency department or PCP for suture removal in 14 days. Patient also instructed to watch for signs of infection and return if he noted changes of such.      ____________________________________________   FINAL CLINICAL IMPRESSION(S) / ED DIAGNOSES  Final  diagnoses:  Laceration of left hand without foreign body, subsequent encounter      NEW MEDICATIONS STARTED DURING THIS VISIT:  Discharge Medication List as of 04/17/2017  8:42 PM       Note:  This document was prepared using Dragon voice recognition software and may include unintentional dictation errors.   Jordan Likes Zaki Gertsch, PA-C 04/17/17 2304    Rockne Menghini, MD 04/17/17 2315

## 2017-04-17 NOTE — Discharge Instructions (Signed)
Keep wound clean and dry. When at work keep clean, dry and covered. When at work no lifting that will increase tension on the wound. Return the the emergency department or your PCP for suture removal in 14 days. If the wounds begins to show signs of infection: redness, warmth, drainage, etc return to the emergency department.

## 2017-06-02 ENCOUNTER — Telehealth: Payer: Self-pay

## 2017-06-02 NOTE — Telephone Encounter (Signed)
Pt had emergency c/s 03/20/17.  The last few days her belly has been hurting and having pressure.  Please call.  [phone]

## 2017-06-03 NOTE — Telephone Encounter (Signed)
Please let me know when pt callsback ?

## 2017-06-05 NOTE — Telephone Encounter (Signed)
Calling pt again today, please let me know if/when she calls back

## 2017-06-09 IMAGING — DX DG ABDOMEN 1V
2 series · 2 of 2 positions shown · non-contrast
Comparison: None.

CLINICAL DATA: 31-year-old female post C-section. Initial
encounter.

EXAM:
ABDOMEN - 1 VIEW

[abdomen kub (1 of 2)]
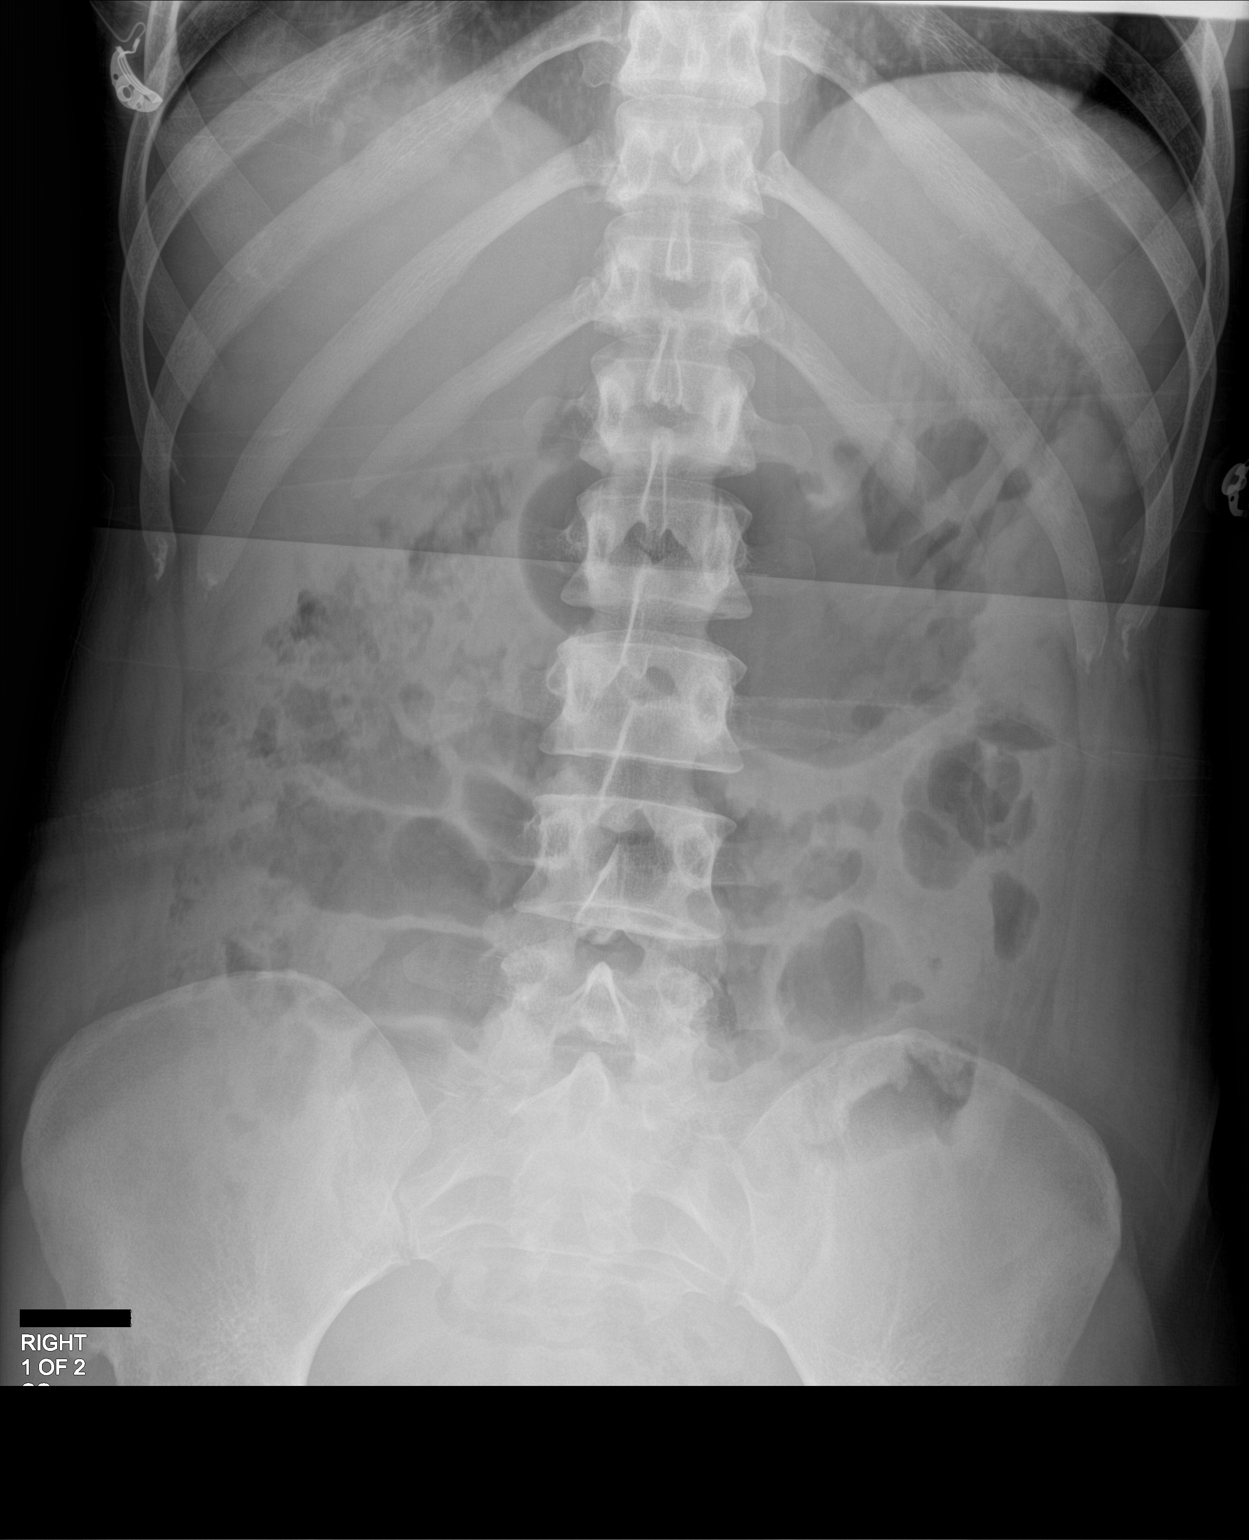

[abdomen kub (2 of 2)]
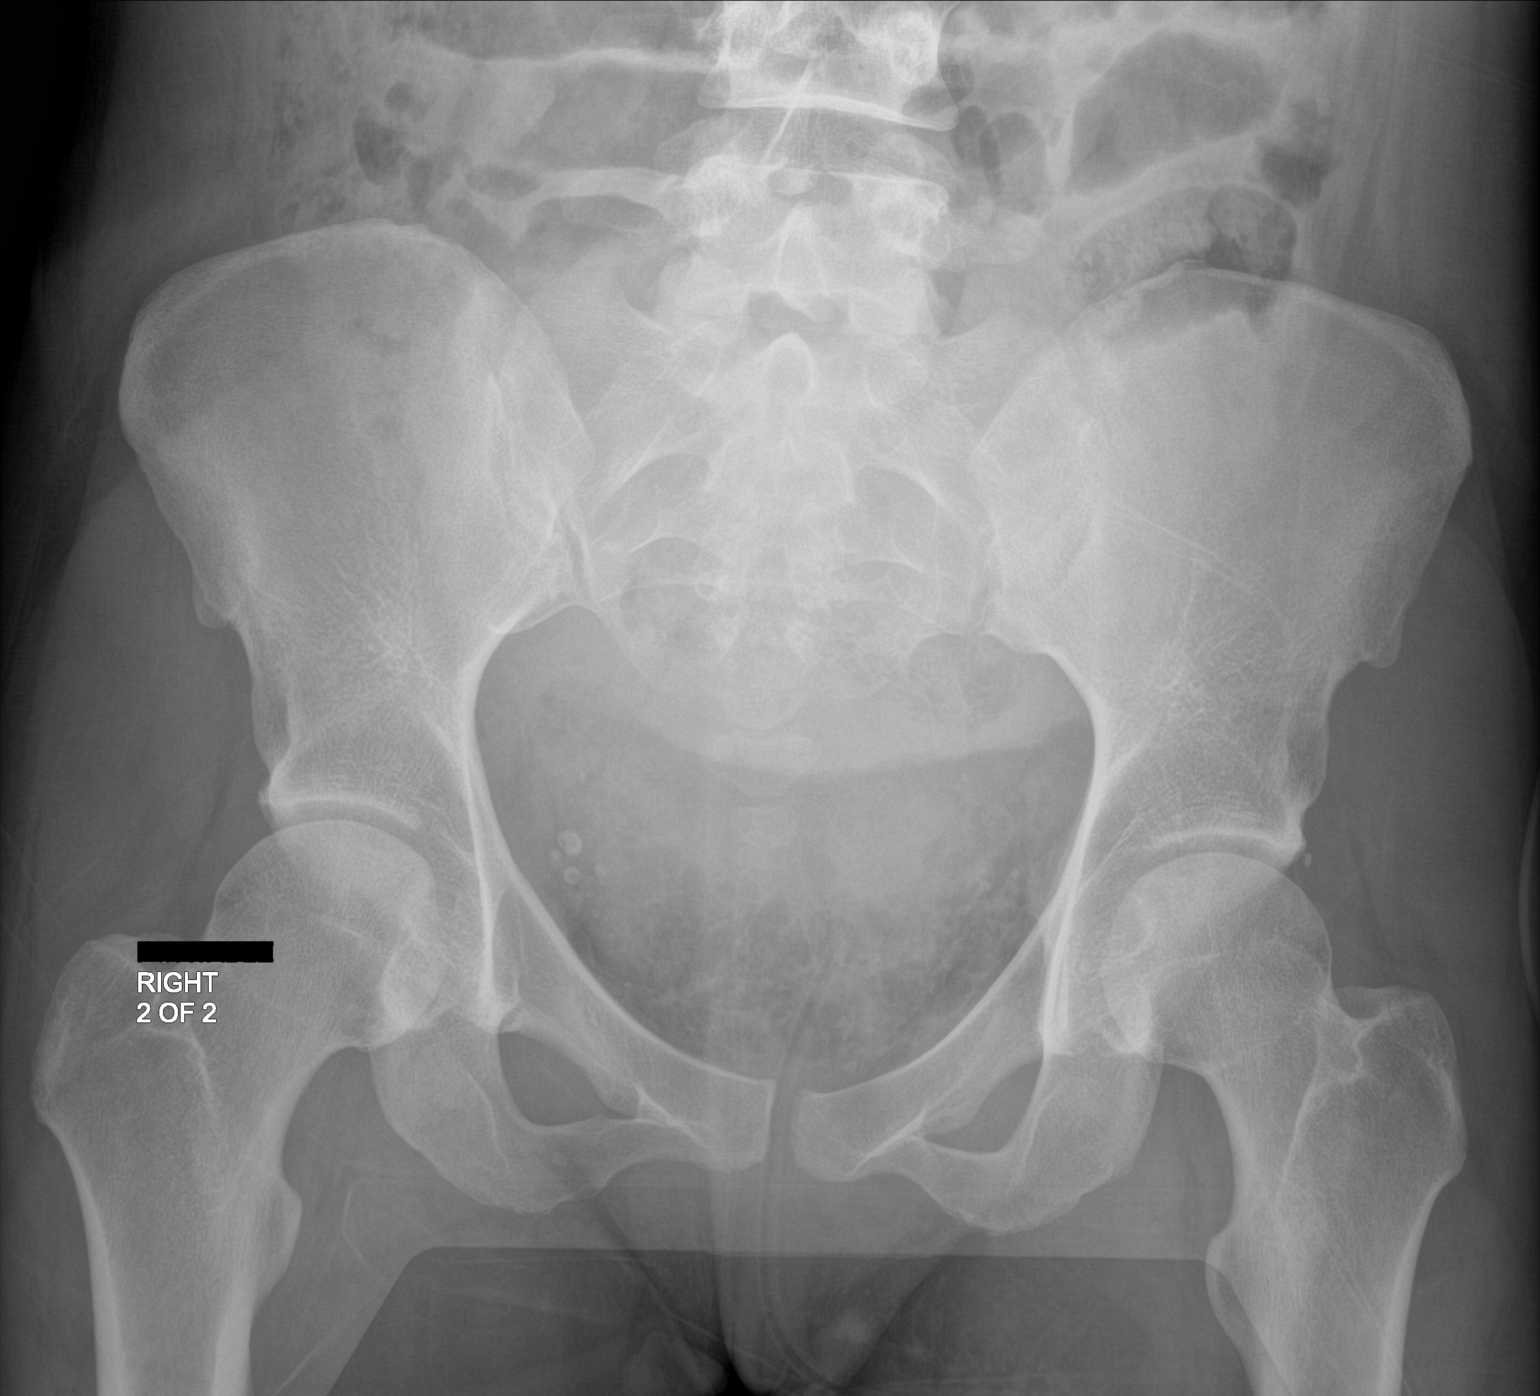

[2 of 2 positions shown; findings below may reference images not displayed]

FINDINGS: No radiopaque foreign body to suggest retained instrument
identified.

Nonspecific bowel gas pattern.

Gas projecting over the pelvis most likely related to recent
procedure.

Foley catheter in place.

No osseous abnormality noted.
IMPRESSION: No radiopaque foreign body to suggest retained instrument
identified.

Gas projecting over the pelvis most likely related to recent
procedure.

These results were called by telephone at the time of interpretation
on 03/17/2017 at [DATE] to Isimeli patient's nurse who verbally
acknowledged these results.

## 2018-01-19 ENCOUNTER — Emergency Department
Admission: EM | Admit: 2018-01-19 | Discharge: 2018-01-19 | Disposition: A | Discharge status: Home / Self Care | Payer: Medicaid Other | Attending: Emergency Medicine | Admitting: Emergency Medicine

## 2018-01-19 ENCOUNTER — Emergency Department: Payer: Medicaid Other

## 2018-01-19 ENCOUNTER — Other Ambulatory Visit: Payer: Self-pay

## 2018-01-19 ENCOUNTER — Encounter: Payer: Self-pay | Admitting: Emergency Medicine

## 2018-01-19 DIAGNOSIS — O218 Other vomiting complicating pregnancy: Secondary | ICD-10-CM | POA: Insufficient documentation

## 2018-01-19 DIAGNOSIS — R102 Pelvic and perineal pain: Secondary | ICD-10-CM | POA: Insufficient documentation

## 2018-01-19 DIAGNOSIS — R112 Nausea with vomiting, unspecified: Secondary | ICD-10-CM

## 2018-01-19 DIAGNOSIS — O9989 Other specified diseases and conditions complicating pregnancy, childbirth and the puerperium: Secondary | ICD-10-CM | POA: Diagnosis present

## 2018-01-19 DIAGNOSIS — O99331 Smoking (tobacco) complicating pregnancy, first trimester: Secondary | ICD-10-CM | POA: Insufficient documentation

## 2018-01-19 DIAGNOSIS — M545 Low back pain, unspecified: Secondary | ICD-10-CM

## 2018-01-19 DIAGNOSIS — O200 Threatened abortion: Secondary | ICD-10-CM | POA: Diagnosis not present

## 2018-01-19 DIAGNOSIS — Z3A12 12 weeks gestation of pregnancy: Secondary | ICD-10-CM | POA: Diagnosis not present

## 2018-01-19 DIAGNOSIS — O418X10 Other specified disorders of amniotic fluid and membranes, first trimester, not applicable or unspecified: Secondary | ICD-10-CM

## 2018-01-19 DIAGNOSIS — O9952 Diseases of the respiratory system complicating childbirth: Secondary | ICD-10-CM | POA: Diagnosis not present

## 2018-01-19 DIAGNOSIS — F17210 Nicotine dependence, cigarettes, uncomplicated: Secondary | ICD-10-CM | POA: Diagnosis not present

## 2018-01-19 DIAGNOSIS — R1030 Lower abdominal pain, unspecified: Secondary | ICD-10-CM

## 2018-01-19 DIAGNOSIS — O468X1 Other antepartum hemorrhage, first trimester: Secondary | ICD-10-CM

## 2018-01-19 LAB — CHLAMYDIA/NGC RT PCR (ARMC ONLY)
Chlamydia Tr: NOT DETECTED
N gonorrhoeae: NOT DETECTED

## 2018-01-19 LAB — URINALYSIS, COMPLETE (UACMP) WITH MICROSCOPIC
BACTERIA UA: NONE SEEN
BILIRUBIN URINE: NEGATIVE
Glucose, UA: NEGATIVE mg/dL
Hgb urine dipstick: NEGATIVE
Ketones, ur: NEGATIVE mg/dL
Leukocytes, UA: NEGATIVE
Nitrite: NEGATIVE
Protein, ur: NEGATIVE mg/dL
RBC / HPF: NONE SEEN RBC/hpf (ref 0–5)
SPECIFIC GRAVITY, URINE: 1.015 (ref 1.005–1.030)
pH: 6 (ref 5.0–8.0)

## 2018-01-19 LAB — CBC
HEMATOCRIT: 36.9 % (ref 35.0–47.0)
Hemoglobin: 12.2 g/dL (ref 12.0–16.0)
MCH: 28.4 pg (ref 26.0–34.0)
MCHC: 33.1 g/dL (ref 32.0–36.0)
MCV: 85.7 fL (ref 80.0–100.0)
Platelets: 187 10*3/uL (ref 150–440)
RBC: 4.31 MIL/uL (ref 3.80–5.20)
RDW: 15.3 % — ABNORMAL HIGH (ref 11.5–14.5)
WBC: 10 10*3/uL (ref 3.6–11.0)

## 2018-01-19 LAB — COMPREHENSIVE METABOLIC PANEL
ALBUMIN: 3.7 g/dL (ref 3.5–5.0)
ALK PHOS: 41 U/L (ref 38–126)
ALT: 8 U/L — ABNORMAL LOW (ref 14–54)
AST: 18 U/L (ref 15–41)
Anion gap: 6 (ref 5–15)
BILIRUBIN TOTAL: 0.6 mg/dL (ref 0.3–1.2)
BUN: 9 mg/dL (ref 6–20)
CALCIUM: 8.6 mg/dL — AB (ref 8.9–10.3)
CO2: 23 mmol/L (ref 22–32)
Chloride: 106 mmol/L (ref 101–111)
Creatinine, Ser: 0.54 mg/dL (ref 0.44–1.00)
GFR calc Af Amer: >60 mL/min (ref >60)
GFR calc non Af Amer: >60 mL/min (ref >60)
GLUCOSE: 94 mg/dL (ref 65–99)
Potassium: 3.7 mmol/L (ref 3.5–5.1)
Sodium: 135 mmol/L (ref 135–145)
TOTAL PROTEIN: 6.8 g/dL (ref 6.5–8.1)

## 2018-01-19 LAB — WET PREP, GENITAL
Clue Cells Wet Prep HPF POC: NONE SEEN
SPERM: NONE SEEN
Trich, Wet Prep: NONE SEEN
YEAST WET PREP: NONE SEEN

## 2018-01-19 LAB — HCG, QUANTITATIVE, PREGNANCY: hCG, Beta Chain, Quant, S: 88817 m[IU]/mL — ABNORMAL HIGH (ref <5)

## 2018-01-19 MED ORDER — ACETAMINOPHEN 325 MG PO TABS
650.0000 mg | ORAL_TABLET | Freq: Once | ORAL | Status: AC
  Administered 2018-01-19: 650 mg via ORAL
  Filled 2018-01-19: qty 2

## 2018-01-19 MED ORDER — METOCLOPRAMIDE HCL 10 MG PO TABS: 10.0000 mg | ORAL_TABLET | Freq: Three times a day (TID) | ORAL | 0 refills | Status: DC | PRN

## 2018-01-19 NOTE — ED Triage Notes (Signed)
Pt states LMP 11/04/2017. Pt complains of back pain, nausea, "spotting when I pee".

## 2018-01-19 NOTE — Discharge Instructions (Signed)
These follow-up with the health department as you have scheduled.  Please take Tylenol and only Tylenol for your pain.  Please return with any other concerns or conditions.

## 2018-01-19 NOTE — ED Provider Notes (Signed)
[facility] Emergency Department Provider Note   ____________________________________________   First MD Initiated Contact with Patient 01/19/18 [phone]     (approximate)  I have reviewed the triage vital signs and the nursing notes.   HISTORY  Chief Complaint Vaginal Bleeding; Nausea; and Back Pain    HPI [name] is a 32 y.o. female who comes into the hospital today with some spotting and lower abdominal pain.  She reports that tonight she felt nauseous, vomited and felt like she is going to pass out.  The patient states that her last menstrual cycle was in November and she has been having some abdominal pain for a while but the spotting just started today.  She is also having some low back pain.  She woke up feeling sweaty and she vomited.  She did not want to pass out at home so she decided to call the ambulance to bring her to get checked out.  The patient states that she has been seen by the OB but did not get a pelvic exam or an ultrasound.  She reports that she has an appointment on the 29th where she can see a female OB/GYN.  The patient states that her pain is a 7-8 out of 10 in intensity.  She is here today for evaluation.  She is a G5 [id number]  Past Medical History:  Diagnosis Date  . Asthma   . Cervical cyst     Patient Active Problem List   Diagnosis Date Noted  . Mild intermittent asthma 03/19/2017  . Pregnancy, supervision, high-risk, third trimester 03/17/2017  . Abruptio placenta, third trimester 03/17/2017  . [redacted] weeks gestation of pregnancy 03/17/2017  . Marijuana use 03/17/2017    Past Surgical History:  Procedure Laterality Date  . CESAREAN SECTION N/A 03/17/2017   Procedure: CESAREAN SECTION;  Surgeon: Conard NovakStephen D Jackson, MD;  Location: [facility] ORS;  Service: Obstetrics;  Laterality: N/A;    Prior to Admission medications   Medication Sig Start Date End Date Taking? Authorizing Provider  albuterol (PROVENTIL HFA;VENTOLIN HFA) 108  (90 Base) MCG/ACT inhaler Inhale 2 puffs into the lungs every 6 (six) hours as needed for wheezing or shortness of breath. 03/19/17   Conard NovakJackson, Stephen D, MD  ibuprofen (ADVIL,MOTRIN) 600 MG tablet Take 1 tablet (600 mg total) by mouth every 6 (six) hours as needed for mild pain or moderate pain. 03/19/17   Conard NovakJackson, Stephen D, MD  metoCLOPramide (REGLAN) 10 MG tablet Take 1 tablet (10 mg total) by mouth every 8 (eight) hours as needed. 01/19/18   Rebecka ApleyWebster, Allison P, MD  oxyCODONE-acetaminophen (PERCOCET/ROXICET) 5-325 MG tablet Take 2 tablets by mouth every 6 (six) hours as needed (breakthrough pain). Patient not taking: Reported on 03/31/2017 03/19/17   Conard NovakJackson, Stephen D, MD    Allergies Demerol [meperidine]  History reviewed. No pertinent family history.  Social History Social History   Tobacco Use  . Smoking status: Current Every Day Smoker    Packs/day: 0.25    Types: Cigarettes  . Smokeless tobacco: Never Used  Substance Use Topics  . Alcohol use: No  . Drug use: Yes    Types: Marijuana    Review of Systems  Constitutional: No fever/chills Eyes: No visual changes. ENT: No sore throat. Cardiovascular: Denies chest pain. Respiratory: Denies shortness of breath. Gastrointestinal:  abdominal pain.  No nausea, no vomiting.  No diarrhea.  No constipation. Genitourinary: Vaginal bleeding Musculoskeletal: back pain. Skin: Negative for rash. Neurological: Negative for headaches, focal weakness  or numbness.   ____________________________________________   PHYSICAL EXAM:  VITAL SIGNS: ED Triage Vitals  Enc Vitals Group     BP 01/19/18 0318 119/90     Pulse Rate 01/19/18 0318 68     Resp 01/19/18 0318 18     Temp 01/19/18 0318 98.1 F (36.7 C)     Temp Source 01/19/18 0318 Oral     SpO2 01/19/18 0318 100 %     Weight 01/19/18 0319 127 lb (57.6 kg)     Height 01/19/18 0319 5\' 7"  (1.702 m)     Head Circumference --      Peak Flow --      Pain Score 01/19/18 0319 7      Pain Loc --      Pain Edu? --      Excl. in GC? --     Constitutional: Alert and oriented. Well appearing and in mild distress. Eyes: Conjunctivae are normal. PERRL. EOMI. Head: Atraumatic. Nose: No congestion/rhinnorhea. Mouth/Throat: Mucous membranes are moist.  Oropharynx non-erythematous. Cardiovascular: Normal rate, regular rhythm. Grossly normal heart sounds.  Good peripheral circulation. Respiratory: Normal respiratory effort.  No retractions. Lungs CTAB. Gastrointestinal: Soft with some soft lower abdominal tenderness to palpation. No distention.  Positive bowel sounds Genitourinary: Normal external genitalia with some white appearing discharge, patient has some mild cervical motion tenderness to palpation as well as some uterine tenderness to palpation. Musculoskeletal: No lower extremity tenderness nor edema.   Neurologic:  Normal speech and language.  Skin:  Skin is warm, dry and intact.  Psychiatric: Mood and affect are normal.   ____________________________________________   LABS (all labs ordered are listed, but only abnormal results are displayed)  Labs Reviewed  WET PREP, GENITAL - Abnormal; Notable for the following components:      Result Value   WBC, Wet Prep HPF POC MODERATE (*)    All other components within normal limits  CBC - Abnormal; Notable for the following components:   RDW 15.3 (*)    All other components within normal limits  COMPREHENSIVE METABOLIC PANEL - Abnormal; Notable for the following components:   Calcium 8.6 (*)    ALT 8 (*)    All other components within normal limits  HCG, QUANTITATIVE, PREGNANCY - Abnormal; Notable for the following components:   hCG, Beta Chain, Quant, S 69,629 (*)    All other components within normal limits  URINALYSIS, COMPLETE (UACMP) WITH MICROSCOPIC - Abnormal; Notable for the following components:   Color, Urine YELLOW (*)    APPearance CLOUDY (*)    Squamous Epithelial / LPF 0-5 (*)    All other  components within normal limits  CHLAMYDIA/NGC RT PCR [facility] ONLY)   ____________________________________________  EKG  none ____________________________________________  RADIOLOGY  US Ob Comp Less 14 Wks  Result Date: 01/19/2018 CLINICAL DATA:  Spotting and abdominal pain. Estimated gestational age by LMP is 10 weeks 6 days. Quantitative beta HCG is 88,817. EXAM: OBSTETRIC <14 WK ULTRASOUND TECHNIQUE: Transabdominal ultrasound was performed for evaluation of the gestation as well as the maternal uterus and adnexal regions. COMPARISON:  None. FINDINGS: Intrauterine gestational sac: A single intrauterine pregnancy is identified. Yolk sac:  Yolk sac is visualized. Embryo:  Fetal pole is present. Cardiac Activity: Fetal cardiac activity is observed. Heart Rate: 141 bpm CRL: 57 mm   12 w 2 d                  [place] EDC: 08/01/2018 Subchorionic hemorrhage: A small inferior  subchorionic hemorrhage is demonstrated. Maternal uterus/adnexae: Uterus is anteverted. No myometrial mass lesions are identified. Ovaries are not visualized. No adnexal masses seen. No free fluid in the pelvis. IMPRESSION: Single living intrauterine pregnancy. Estimated gestational age by crown-rump length is 12 weeks 2 days. Small subchorionic hemorrhage is identified. Electronically Signed   By: Burman Nieves M.D.   On: 01/19/2018 05:19    ____________________________________________   PROCEDURES  Procedure(s) performed: None  Procedures  Critical Care performed: No  ____________________________________________   INITIAL IMPRESSION / ASSESSMENT AND PLAN / ED COURSE  As part of my medical decision making, I reviewed the following data within the electronic MEDICAL RECORD NUMBER Notes from prior ED visits and [place] Controlled Substance Database   This is a 32 year old female who comes into the hospital today with some abdominal pain, back pain and vaginal bleeding.  The patient is [redacted] weeks pregnant.  My  differential diagnosis includes miscarriage, threatened miscarriage, ectopic pregnancy, benign uterine pain.  We did check some blood work on the patient.  The patient CBC and CMP are unremarkable.  I also did a wet prep which showed some moderate white blood cells but otherwise unremarkable.  I will send the patient for an ultrasound since she has no confirmed intrauterine pregnancy.  She will receive a dose of Tylenol and she will be reassessed.     The patient's ultrasound shows a single living intrauterine pregnancy with a small subchorionic hemorrhage.  The gestational age is 12 weeks and 2 days.  The patient should follow-up with her OB/GYN as she already has scheduled on the 29th. ____________________________________________   FINAL CLINICAL IMPRESSION(S) / ED DIAGNOSES  Final diagnoses:  Threatened miscarriage  Subchorionic hematoma in first trimester, single or unspecified fetus  Acute midline low back pain without sciatica  Lower abdominal pain  Non-intractable vomiting with nausea, unspecified vomiting type     ED Discharge Orders        Ordered    metoCLOPramide (REGLAN) 10 MG tablet  Every 8 hours PRN     01/19/18 0534       Note:  This document was prepared using Dragon voice recognition software and may include unintentional dictation errors.    Rebecka Apley, MD 01/19/18 [phone]

## 2018-01-19 NOTE — ED Notes (Signed)
Patient transported to Ultrasound 

## 2018-01-24 LAB — OB RESULTS CONSOLE RPR: RPR: NONREACTIVE

## 2018-01-24 LAB — OB RESULTS CONSOLE VARICELLA ZOSTER ANTIBODY, IGG: Varicella: IMMUNE

## 2018-01-24 LAB — OB RESULTS CONSOLE HEPATITIS B SURFACE ANTIGEN: HEP B S AG: NEGATIVE

## 2018-04-14 ENCOUNTER — Other Ambulatory Visit: Payer: Self-pay | Admitting: Family Medicine

## 2018-04-14 DIAGNOSIS — O0990 Supervision of high risk pregnancy, unspecified, unspecified trimester: Secondary | ICD-10-CM

## 2018-05-16 LAB — OB RESULTS CONSOLE HIV ANTIBODY (ROUTINE TESTING): HIV: NONREACTIVE

## 2018-06-10 ENCOUNTER — Ambulatory Visit
Admission: RE | Admit: 2018-06-10 | Discharge: 2018-06-10 | Disposition: A | Discharge status: Home / Self Care | Payer: Medicaid Other | Source: Ambulatory Visit | Attending: Family Medicine | Admitting: Family Medicine

## 2018-06-17 ENCOUNTER — Telehealth: Payer: Self-pay | Admitting: Family Medicine

## 2018-06-18 ENCOUNTER — Ambulatory Visit: Payer: Medicaid Other

## 2018-07-03 LAB — OB RESULTS CONSOLE GC/CHLAMYDIA
Chlamydia: NEGATIVE
Gonorrhea: NEGATIVE

## 2018-07-07 NOTE — H&P (Addendum)
Bethena Midgetshley Currie is a 32 y.o. female presenting for elective repeat LTCS on 07/28/18 . EDC 08/01/18 based on 12+2 week u/s .  Prior C/S for abruption  GETA . 2 other SVD  [facility] Pregnancy problems : + tobacco use and + MJ use  OB History    Gravida  5   Para  3   Term  2   Preterm  1   AB  1   Living  3     SAB  1   TAB      Ectopic      Multiple      Live Births             Past Medical History:  Diagnosis Date  . Asthma   . Cervical cyst    Past Surgical History:  Procedure Laterality Date  . CESAREAN SECTION N/A 03/17/2017   Procedure: CESAREAN SECTION;  Surgeon: Conard NovakStephen D Jackson, MD;  Location: [facility] ORS;  Service: Obstetrics;  Laterality: N/A;   Family History: family history is not on file. Social History:  reports that she has been smoking cigarettes.  She has been smoking about 0.25 packs per day. She has never used smokeless tobacco. She reports that she has current or past drug history. Drug: Marijuana. She reports that she does not drink alcohol.     Maternal Diabetes: No Genetic Screening: Normal Maternal Ultrasounds/Referrals: Normal Fetal Ultrasounds or other Referrals:  None Maternal Substance Abuse:  Yes:  Type: Smoker, Marijuana Significant Maternal Medications:  None Significant Maternal Lab Results:  None Other Comments:  None  ROS History   Last menstrual period 11/04/2017, unknown if currently breastfeeding. Exam Physical Exam  Prenatal labs: ABO, Rh:  O+ Antibody:  neg Rubella:  Imm , varicella Imm RPR:   NR HBsAg:   neg HIV:   neg  GBS:   +positive   Assessment/Plan: Elective repeat LTCS  07/28/18  Ihor Austinhomas J Anisah Kuck 07/07/2018, 2:13 PM
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
## 2018-07-14 ENCOUNTER — Other Ambulatory Visit: Payer: Self-pay

## 2018-07-14 ENCOUNTER — Encounter: Payer: Self-pay | Admitting: Certified Nurse Midwife

## 2018-07-14 ENCOUNTER — Observation Stay
Admission: EM | Admit: 2018-07-14 | Discharge: 2018-07-14 | Disposition: A | Discharge status: Home / Self Care | Payer: Medicaid Other | Attending: Certified Nurse Midwife | Admitting: Certified Nurse Midwife

## 2018-07-14 DIAGNOSIS — O26893 Other specified pregnancy related conditions, third trimester: Secondary | ICD-10-CM | POA: Diagnosis not present

## 2018-07-14 DIAGNOSIS — Z3A37 37 weeks gestation of pregnancy: Secondary | ICD-10-CM | POA: Diagnosis not present

## 2018-07-14 DIAGNOSIS — R102 Pelvic and perineal pain: Secondary | ICD-10-CM | POA: Diagnosis not present

## 2018-07-14 DIAGNOSIS — O479 False labor, unspecified: Secondary | ICD-10-CM | POA: Diagnosis present

## 2018-07-14 LAB — URINALYSIS, COMPLETE (UACMP) WITH MICROSCOPIC
BILIRUBIN URINE: NEGATIVE
Glucose, UA: NEGATIVE mg/dL
Hgb urine dipstick: NEGATIVE
KETONES UR: 5 mg/dL — AB
LEUKOCYTES UA: NEGATIVE
Nitrite: NEGATIVE
PROTEIN: NEGATIVE mg/dL
Specific Gravity, Urine: 1.015 (ref 1.005–1.030)
pH: 6 (ref 5.0–8.0)

## 2018-07-14 MED ORDER — ACETAMINOPHEN 325 MG PO TABS: 650.0000 mg | ORAL_TABLET | ORAL | 0 refills | Status: DC | PRN

## 2018-07-14 MED ORDER — ACETAMINOPHEN 325 MG PO TABS
650.0000 mg | ORAL_TABLET | ORAL | Status: DC | PRN
  Administered 2018-07-14: 650 mg via ORAL
  Filled 2018-07-14: qty 2

## 2018-07-14 NOTE — OB Triage Note (Signed)
Discharge instructions provided and reviewed.  Labor precautions and follow up care discussed.  Pt verbalized understanding.

## 2018-07-14 NOTE — OB Triage Note (Signed)
Pt c/o perineum pain that started last night and has gotten worse throughout the day. She denies any vaginal bleeding or leaking fluid. She says the baby is still moving around okay.

## 2018-07-14 NOTE — Discharge Summary (Signed)
[name] is a 32 y.o. female. She is at [redacted]w[redacted]d gestation. Patient's last menstrual period was 11/04/2017. Estimated Date of Delivery: 08/01/18  [facility]  Chief complaint: Pelvic/lower abdominal pain Location: Pelvis/lower abdomen/perineum Onset/timing: last night Duration: intermittent Quality: aching, sometimes sharp Severity: mild to moderate, has worsened as the day has gone by Aggravating or alleviating conditions: more comfortable when she is sitting Associated signs/symptoms: none Context: [name] reports a pain that started yesterday evening and has gotten worse throughout the day. She does not compare the pain to contractions and has not timed it. It is intermittent in nature and sometimes achy and sometimes sharp. She has not tried anything at home to see if it helps.   S: Resting comfortably in bed, support people at the bedside.   She reports:  -active fetal movement -no leakage of fluid -no vaginal bleeding -no contractions  Maternal Medical History:   Past Medical History:  Diagnosis Date  . Asthma   . Cervical cyst     Past Surgical History:  Procedure Laterality Date  . CESAREAN SECTION N/A 03/17/2017   Procedure: CESAREAN SECTION;  Surgeon: Conard Novak, MD;  Location: [facility] ORS;  Service: Obstetrics;  Laterality: N/A;    Allergies  Allergen Reactions  . Peanut-Containing Drug Products Anaphylaxis and Hives  . Demerol [Meperidine] Hives    Prior to Admission medications   Medication Sig Start Date End Date Taking? Authorizing Provider  albuterol (PROVENTIL HFA;VENTOLIN HFA) 108 (90 Base) MCG/ACT inhaler Inhale 2 puffs into the lungs every 6 (six) hours as needed for wheezing or shortness of breath. 03/19/17   Conard Novak, MD  ibuprofen (ADVIL,MOTRIN) 600 MG tablet Take 1 tablet (600 mg total) by mouth every 6 (six) hours as needed for mild pain or moderate pain. 03/19/17   Conard Novak, MD  metoCLOPramide  (REGLAN) 10 MG tablet Take 1 tablet (10 mg total) by mouth every 8 (eight) hours as needed. 01/19/18   Rebecka Apley, MD  oxyCODONE-acetaminophen (PERCOCET/ROXICET) 5-325 MG tablet Take 2 tablets by mouth every 6 (six) hours as needed (breakthrough pain). Patient not taking: Reported on 03/31/2017 03/19/17   Conard Novak, MD     Social History: She  reports that she has been smoking cigarettes.  She has been smoking about 0.25 packs per day. She has never used smokeless tobacco. She reports that she has current or past drug history. Drug: Marijuana. She reports that she does not drink alcohol.  Family History: family history is not on file.   Review of Systems: A full review of systems was performed and negative except as noted in the HPI.    O:  BP (!) 149/77 (BP Location: Left Arm)   Pulse 71   Temp 98 F (36.7 C) (Oral)   Resp 16   Ht 5\' 7"  (1.702 m)   Wt 70.3 kg (155 lb)   LMP 11/04/2017   BMI 24.28 kg/m  Results for orders placed or performed during the hospital encounter of 07/14/18 (from the past 48 hour(s))  Urinalysis, Complete w Microscopic   Collection Time: 07/14/18  7:50 PM  Result Value Ref Range   Color, Urine YELLOW (A) YELLOW   APPearance HAZY (A) CLEAR   Specific Gravity, Urine 1.015 1.005 - 1.030   pH 6.0 5.0 - 8.0   Glucose, UA NEGATIVE NEGATIVE mg/dL   Hgb urine dipstick NEGATIVE NEGATIVE   Bilirubin Urine NEGATIVE NEGATIVE   Ketones, ur 5 (A)  NEGATIVE mg/dL   Protein, ur NEGATIVE NEGATIVE mg/dL   Nitrite NEGATIVE NEGATIVE   Leukocytes, UA NEGATIVE NEGATIVE   RBC / HPF 0-5 0 - 5 RBC/hpf   WBC, UA 0-5 0 - 5 WBC/hpf   Bacteria, UA RARE (A) NONE SEEN   Squamous Epithelial / LPF 21-50 0 - 5   Mucus PRESENT      Constitutional: NAD, AAOx3  HE/ENT: extraocular movements grossly intact, moist mucous membranes CV: RRR PULM: nl respiratory effort, CTABL     Abd: gravid, non-tender, non-distended, soft      Ext: Non-tender, Nonedmeatous   Psych:  mood appropriate, speech normal Pelvic: SVE 1748 by RN [name] 1cm/50%/-3 posterior/medium  SVE 2000 by me 1cm/50%/-3 posterior/medium  NST/monitoring:  Baseline: 130bpm Variability: moderate Accelerations: 15x15s present x >2 Decelerations: absent Toco: contractions occasional, every 8-14 minutes Time: 90mins  A/P: 32 y.o. [id number] here for antenatal surveillance for pelvic and abdominal pain and pressure  Labor: not present, no cervical change after 2 hours, patient appears comfortable  Fetal Wellbeing: Reassuring Cat 1 tracing.  Reactive NST   Pain  Tylenol given; advised hydration and Tylenol PRN at home.   Recent UTI with antibiotic treatment  UA with no evidence of current infection  D/c home stable, precautions reviewed, follow-up as scheduled.   ----- Genia DelMargaret Huber Mathers, CNM Certified Nurse Midwife [facility], Department of OB/GYN [facility]

## 2018-07-27 ENCOUNTER — Encounter
Admission: RE | Admit: 2018-07-27 | Discharge: 2018-07-27 | Disposition: A | Discharge status: Home / Self Care | Payer: Medicaid Other | Source: Ambulatory Visit | Attending: Obstetrics and Gynecology | Admitting: Obstetrics and Gynecology

## 2018-07-27 ENCOUNTER — Other Ambulatory Visit: Payer: Self-pay

## 2018-07-27 LAB — TYPE AND SCREEN
ABO/RH(D): O POS
Antibody Screen: NEGATIVE
EXTEND SAMPLE REASON: UNDETERMINED

## 2018-07-27 LAB — CBC
HCT: 33.5 % — ABNORMAL LOW (ref 35.0–47.0)
Hemoglobin: 11.4 g/dL — ABNORMAL LOW (ref 12.0–16.0)
MCH: 29.6 pg (ref 26.0–34.0)
MCHC: 33.9 g/dL (ref 32.0–36.0)
MCV: 87.2 fL (ref 80.0–100.0)
PLATELETS: 135 10*3/uL — AB (ref 150–440)
RBC: 3.84 MIL/uL (ref 3.80–5.20)
RDW: 14.4 % (ref 11.5–14.5)
WBC: 10 10*3/uL (ref 3.6–11.0)

## 2018-07-27 NOTE — Patient Instructions (Signed)
Your procedure is scheduled on: July 28, 2018 TUESDAY Report to EMERGENCY DEPARTMENT AT 11:30 AM   REMEMBER: Instructions that are not followed completely may result in serious medical risk, up to and including death; or upon the discretion of your surgeon and anesthesiologist your surgery may need to be rescheduled.  Do not eat food after midnight the night before surgery.  No gum chewing, lozengers or hard candies.  You may however, drink CLEAR liquids up to 2 hours before you are scheduled to arrive for your surgery. Do not drink anything within 2 hours of the start of your surgery.  Clear liquids include: - water  - apple juice without pulp - gatorade - black coffee or tea (Do NOT add milk or creamers to the coffee or tea) Do NOT drink anything that is not on this list.  Type 1 and Type 2 diabetics should only drink water.  No Alcohol for 24 hours before or after surgery.  No Smoking including e-cigarettes for 24 hours prior to surgery.  No chewable tobacco products for at least 6 hours prior to surgery.  No nicotine patches on the day of surgery.  On the morning of surgery brush your teeth with toothpaste and water, you may rinse your mouth with mouthwash if you wish. Do not swallow any toothpaste or mouthwash.  Notify your doctor if there is any change in your medical condition (cold, fever, infection).  Do not wear jewelry, make-up, hairpins, clips or nail polish.  Do not wear lotions, powders, or perfumes. You may wear deodorant.  Do not shave 48 hours prior to surgery. Men may shave face and neck.  Contacts and dentures may not be worn into surgery.  Do not bring valuables to the hospital, including drivers license, insurance or credit cards.  [place] is not responsible for any belongings or valuables.   TAKE THESE MEDICATIONS THE MORNING OF SURGERY: NONE  Use CHG wipes as directed on instruction sheet.  (May take Tylenol or Acetaminophen if  needed.)  Stop ANY OVER THE COUNTER supplements until after surgery. (May continue Vitamin D, Vitamin B, and multivitamin.)  Wear comfortable clothing (specific to your surgery type) to the hospital.  Plan for stool softeners for home use.  If you are being admitted to the hospital overnight, leave your suitcase in the car. After surgery it may be brought to your room.  If you are being discharged the day of surgery, you will not be allowed to drive home. You will need a responsible adult to drive you home and stay with you that night.   If you are taking public transportation, you will need to have a responsible adult with you. Please confirm with your physician that it is acceptable to use public transportation.   Please call [phone] if you have any questions about these instructions.

## 2018-07-28 ENCOUNTER — Other Ambulatory Visit: Payer: Self-pay

## 2018-07-28 ENCOUNTER — Inpatient Hospital Stay: Payer: Medicaid Other | Admitting: Anesthesiology

## 2018-07-28 ENCOUNTER — Encounter
Admission: EM | Disposition: A | Discharge status: Home / Self Care | Payer: Self-pay | Source: Home / Self Care | Attending: Obstetrics and Gynecology

## 2018-07-28 ENCOUNTER — Inpatient Hospital Stay
Admission: RE | Admit: 2018-07-28 | Payer: Medicaid Other | Source: Ambulatory Visit | Admitting: Obstetrics and Gynecology

## 2018-07-28 ENCOUNTER — Inpatient Hospital Stay
Admission: EM | Admit: 2018-07-28 | Discharge: 2018-07-29 | DRG: 787 | Disposition: A | Discharge status: Home / Self Care | Payer: Medicaid Other | Attending: Obstetrics and Gynecology | Admitting: Obstetrics and Gynecology

## 2018-07-28 ENCOUNTER — Encounter: Payer: Self-pay | Admitting: *Deleted

## 2018-07-28 DIAGNOSIS — D62 Acute posthemorrhagic anemia: Secondary | ICD-10-CM | POA: Diagnosis not present

## 2018-07-28 DIAGNOSIS — O99324 Drug use complicating childbirth: Secondary | ICD-10-CM | POA: Diagnosis present

## 2018-07-28 DIAGNOSIS — Z3A39 39 weeks gestation of pregnancy: Secondary | ICD-10-CM | POA: Diagnosis not present

## 2018-07-28 DIAGNOSIS — O34219 Maternal care for unspecified type scar from previous cesarean delivery: Secondary | ICD-10-CM | POA: Diagnosis present

## 2018-07-28 DIAGNOSIS — O9081 Anemia of the puerperium: Secondary | ICD-10-CM | POA: Diagnosis not present

## 2018-07-28 DIAGNOSIS — J4520 Mild intermittent asthma, uncomplicated: Secondary | ICD-10-CM | POA: Diagnosis present

## 2018-07-28 DIAGNOSIS — F1290 Cannabis use, unspecified, uncomplicated: Secondary | ICD-10-CM | POA: Diagnosis present

## 2018-07-28 DIAGNOSIS — O99334 Smoking (tobacco) complicating childbirth: Secondary | ICD-10-CM | POA: Diagnosis present

## 2018-07-28 DIAGNOSIS — O9952 Diseases of the respiratory system complicating childbirth: Secondary | ICD-10-CM | POA: Diagnosis present

## 2018-07-28 DIAGNOSIS — Z9889 Other specified postprocedural states: Secondary | ICD-10-CM

## 2018-07-28 DIAGNOSIS — O34211 Maternal care for low transverse scar from previous cesarean delivery: Principal | ICD-10-CM | POA: Diagnosis present

## 2018-07-28 LAB — URINE DRUG SCREEN, QUALITATIVE (ARMC ONLY)
Amphetamines, Ur Screen: NOT DETECTED
BARBITURATES, UR SCREEN: NOT DETECTED
CANNABINOID 50 NG, UR ~~LOC~~: POSITIVE — AB
COCAINE METABOLITE, UR ~~LOC~~: NOT DETECTED
MDMA (Ecstasy)Ur Screen: NOT DETECTED
Methadone Scn, Ur: NOT DETECTED
OPIATE, UR SCREEN: NOT DETECTED
PHENCYCLIDINE (PCP) UR S: NOT DETECTED
Tricyclic, Ur Screen: NOT DETECTED

## 2018-07-28 SURGERY — Surgical Case
Anesthesia: Spinal | Site: Abdomen | Wound class: "Clean Contaminated "

## 2018-07-28 MED ORDER — FENTANYL CITRATE (PF) 100 MCG/2ML IJ SOLN
INTRAMUSCULAR | Status: DC | PRN
  Administered 2018-07-28: 50 ug via INTRAVENOUS

## 2018-07-28 MED ORDER — OXYTOCIN 40 UNITS IN LACTATED RINGERS INFUSION - SIMPLE MED
2.5000 [IU]/h | INTRAVENOUS | Status: AC
  Filled 2018-07-28: qty 1000

## 2018-07-28 MED ORDER — SODIUM CHLORIDE 0.9 % IJ SOLN
INTRAMUSCULAR | Status: AC
  Filled 2018-07-28: qty 50

## 2018-07-28 MED ORDER — GLYCOPYRROLATE 0.2 MG/ML IJ SOLN
INTRAMUSCULAR | Status: DC | PRN
  Administered 2018-07-28: 0.2 mg via INTRAVENOUS

## 2018-07-28 MED ORDER — NALBUPHINE HCL 10 MG/ML IJ SOLN: 5.0000 mg | INTRAMUSCULAR | Status: DC | PRN

## 2018-07-28 MED ORDER — ONDANSETRON HCL 4 MG/2ML IJ SOLN
INTRAMUSCULAR | Status: DC | PRN
  Administered 2018-07-28: 4 mg via INTRAVENOUS

## 2018-07-28 MED ORDER — MORPHINE SULFATE (PF) 0.5 MG/ML IJ SOLN
INTRAMUSCULAR | Status: DC | PRN
  Administered 2018-07-28: .1 mg via EPIDURAL

## 2018-07-28 MED ORDER — LACTATED RINGERS IV SOLN: INTRAVENOUS | Status: DC

## 2018-07-28 MED ORDER — PRENATAL MULTIVITAMIN CH
1.0000 | ORAL_TABLET | Freq: Every day | ORAL | Status: DC
  Administered 2018-07-29: 1 via ORAL
  Filled 2018-07-28: qty 1

## 2018-07-28 MED ORDER — ZOLPIDEM TARTRATE 5 MG PO TABS
5.0000 mg | ORAL_TABLET | Freq: Every evening | ORAL | Status: DC | PRN
Start: 2018-07-28 — End: 2018-07-30

## 2018-07-28 MED ORDER — SODIUM CHLORIDE 0.9% FLUSH: 3.0000 mL | INTRAVENOUS | Status: DC | PRN

## 2018-07-28 MED ORDER — SOD CITRATE-CITRIC ACID 500-334 MG/5ML PO SOLN
30.0000 mL | ORAL | Status: AC
  Administered 2018-07-28: 30 mL via ORAL
  Filled 2018-07-28: qty 15

## 2018-07-28 MED ORDER — IBUPROFEN 600 MG PO TABS
600.0000 mg | ORAL_TABLET | Freq: Four times a day (QID) | ORAL | Status: DC
  Administered 2018-07-28 – 2018-07-29 (×5): 600 mg via ORAL
  Filled 2018-07-28 (×5): qty 1

## 2018-07-28 MED ORDER — MISOPROSTOL 200 MCG PO TABS
ORAL_TABLET | ORAL | Status: AC
  Filled 2018-07-28: qty 4

## 2018-07-28 MED ORDER — BUPIVACAINE HCL (PF) 0.5 % IJ SOLN
INTRAMUSCULAR | Status: AC
  Filled 2018-07-28: qty 30

## 2018-07-28 MED ORDER — LACTATED RINGERS IV SOLN
INTRAVENOUS | Status: DC
  Administered 2018-07-28: 1000 mL via INTRAVENOUS

## 2018-07-28 MED ORDER — SIMETHICONE 80 MG PO CHEW: 80.0000 mg | CHEWABLE_TABLET | ORAL | Status: DC

## 2018-07-28 MED ORDER — EPHEDRINE SULFATE 50 MG/ML IJ SOLN
INTRAMUSCULAR | Status: DC | PRN
  Administered 2018-07-28: 5 mg via INTRAVENOUS

## 2018-07-28 MED ORDER — EPHEDRINE SULFATE-NACL 50-0.9 MG/10ML-% IV SOSY: PREFILLED_SYRINGE | INTRAVENOUS | Status: DC | PRN

## 2018-07-28 MED ORDER — KETOROLAC TROMETHAMINE 30 MG/ML IJ SOLN: 30.0000 mg | Freq: Four times a day (QID) | INTRAMUSCULAR | Status: AC | PRN

## 2018-07-28 MED ORDER — NALBUPHINE HCL 10 MG/ML IJ SOLN
5.0000 mg | INTRAMUSCULAR | Status: DC | PRN
  Administered 2018-07-28 – 2018-07-29 (×2): 5 mg via INTRAVENOUS
  Filled 2018-07-28 (×2): qty 1

## 2018-07-28 MED ORDER — BUPIVACAINE LIPOSOME 1.3 % IJ SUSP
INTRAMUSCULAR | Status: DC | PRN
  Administered 2018-07-28: 15:00:00

## 2018-07-28 MED ORDER — MEPERIDINE HCL 25 MG/ML IJ SOLN: 6.2500 mg | INTRAMUSCULAR | Status: DC | PRN

## 2018-07-28 MED ORDER — TETANUS-DIPHTH-ACELL PERTUSSIS 5-2.5-18.5 LF-MCG/0.5 IM SUSP: 0.5000 mL | Freq: Once | INTRAMUSCULAR | Status: DC

## 2018-07-28 MED ORDER — SODIUM CHLORIDE 0.9 % IJ SOLN
INTRAMUSCULAR | Status: DC | PRN
  Administered 2018-07-28: 50 mL via INTRAVENOUS

## 2018-07-28 MED ORDER — WITCH HAZEL-GLYCERIN EX PADS: 1.0000 "application " | MEDICATED_PAD | CUTANEOUS | Status: DC | PRN

## 2018-07-28 MED ORDER — ACETAMINOPHEN 325 MG PO TABS: 650.0000 mg | ORAL_TABLET | ORAL | Status: DC | PRN

## 2018-07-28 MED ORDER — DIPHENHYDRAMINE HCL 25 MG PO CAPS: 25.0000 mg | ORAL_CAPSULE | ORAL | Status: DC | PRN

## 2018-07-28 MED ORDER — METHYLERGONOVINE MALEATE 0.2 MG/ML IJ SOLN
INTRAMUSCULAR | Status: AC
  Filled 2018-07-28: qty 1

## 2018-07-28 MED ORDER — CARBOPROST TROMETHAMINE 250 MCG/ML IM SOLN
INTRAMUSCULAR | Status: AC
  Filled 2018-07-28: qty 1

## 2018-07-28 MED ORDER — PROPOFOL 10 MG/ML IV BOLUS
INTRAVENOUS | Status: AC
Start: 2018-07-28 — End: ?
  Filled 2018-07-28: qty 20

## 2018-07-28 MED ORDER — SODIUM CHLORIDE 0.9 % IV SOLN
INTRAVENOUS | Status: DC | PRN
  Administered 2018-07-28: 50 ug/min via INTRAVENOUS

## 2018-07-28 MED ORDER — NALBUPHINE HCL 10 MG/ML IJ SOLN: 5.0000 mg | Freq: Once | INTRAMUSCULAR | Status: DC | PRN

## 2018-07-28 MED ORDER — SIMETHICONE 80 MG PO CHEW
80.0000 mg | CHEWABLE_TABLET | ORAL | Status: DC | PRN
  Administered 2018-07-29: 80 mg via ORAL
  Filled 2018-07-28: qty 1

## 2018-07-28 MED ORDER — NALOXONE HCL 0.4 MG/ML IJ SOLN: 0.4000 mg | INTRAMUSCULAR | Status: DC | PRN

## 2018-07-28 MED ORDER — MIDAZOLAM HCL 2 MG/2ML IJ SOLN
INTRAMUSCULAR | Status: DC | PRN
  Administered 2018-07-28: 2 mg via INTRAVENOUS

## 2018-07-28 MED ORDER — ACETAMINOPHEN 500 MG PO TABS
1000.0000 mg | ORAL_TABLET | Freq: Four times a day (QID) | ORAL | Status: AC
  Administered 2018-07-28 – 2018-07-29 (×3): 1000 mg via ORAL
  Filled 2018-07-28 (×3): qty 2

## 2018-07-28 MED ORDER — PHENYLEPHRINE HCL 10 MG/ML IJ SOLN
INTRAMUSCULAR | Status: AC
  Filled 2018-07-28: qty 1

## 2018-07-28 MED ORDER — FENTANYL CITRATE (PF) 100 MCG/2ML IJ SOLN: 25.0000 ug | INTRAMUSCULAR | Status: DC | PRN

## 2018-07-28 MED ORDER — DIBUCAINE 1 % RE OINT: 1.0000 "application " | TOPICAL_OINTMENT | RECTAL | Status: DC | PRN

## 2018-07-28 MED ORDER — CEFAZOLIN SODIUM-DEXTROSE 2-4 GM/100ML-% IV SOLN
2.0000 g | INTRAVENOUS | Status: AC
  Administered 2018-07-28: 2 g via INTRAVENOUS
  Filled 2018-07-28: qty 100

## 2018-07-28 MED ORDER — ONDANSETRON HCL 4 MG/2ML IJ SOLN: 4.0000 mg | Freq: Once | INTRAMUSCULAR | Status: DC | PRN

## 2018-07-28 MED ORDER — DEXAMETHASONE SODIUM PHOSPHATE 10 MG/ML IJ SOLN
INTRAMUSCULAR | Status: DC | PRN
  Administered 2018-07-28: 10 mg via INTRAVENOUS

## 2018-07-28 MED ORDER — OXYTOCIN 40 UNITS IN LACTATED RINGERS INFUSION - SIMPLE MED
INTRAVENOUS | Status: DC | PRN
  Administered 2018-07-28: 500 mL via INTRAVENOUS

## 2018-07-28 MED ORDER — OXYCODONE-ACETAMINOPHEN 5-325 MG PO TABS
1.0000 | ORAL_TABLET | ORAL | Status: DC | PRN
  Administered 2018-07-29 (×4): 1 via ORAL
  Filled 2018-07-28 (×4): qty 1

## 2018-07-28 MED ORDER — SIMETHICONE 80 MG PO CHEW
80.0000 mg | CHEWABLE_TABLET | Freq: Three times a day (TID) | ORAL | Status: DC
  Administered 2018-07-28 – 2018-07-29 (×3): 80 mg via ORAL
  Filled 2018-07-28 (×3): qty 1

## 2018-07-28 MED ORDER — OXYCODONE-ACETAMINOPHEN 5-325 MG PO TABS: 2.0000 | ORAL_TABLET | ORAL | Status: DC | PRN

## 2018-07-28 MED ORDER — BUPIVACAINE IN DEXTROSE 0.75-8.25 % IT SOLN
INTRATHECAL | Status: DC | PRN
  Administered 2018-07-28: 1.6 mL via INTRATHECAL

## 2018-07-28 MED ORDER — DIPHENHYDRAMINE HCL 25 MG PO CAPS: 25.0000 mg | ORAL_CAPSULE | Freq: Four times a day (QID) | ORAL | Status: DC | PRN

## 2018-07-28 MED ORDER — BUPIVACAINE LIPOSOME 1.3 % IJ SUSP
20.0000 mL | Freq: Once | INTRAMUSCULAR | Status: DC
  Filled 2018-07-28: qty 20

## 2018-07-28 MED ORDER — DIPHENHYDRAMINE HCL 50 MG/ML IJ SOLN: 12.5000 mg | INTRAMUSCULAR | Status: DC | PRN

## 2018-07-28 MED ORDER — SENNOSIDES-DOCUSATE SODIUM 8.6-50 MG PO TABS
2.0000 | ORAL_TABLET | ORAL | Status: DC
  Administered 2018-07-29: 2 via ORAL
  Filled 2018-07-28: qty 2

## 2018-07-28 MED ORDER — MENTHOL 3 MG MT LOZG
1.0000 | LOZENGE | OROMUCOSAL | Status: DC | PRN
  Filled 2018-07-28: qty 9

## 2018-07-28 MED ORDER — LACTATED RINGERS IV BOLUS
1000.0000 mL | Freq: Once | INTRAVENOUS | Status: AC
  Administered 2018-07-28: 1000 mL via INTRAVENOUS

## 2018-07-28 MED ORDER — COCONUT OIL OIL
1.0000 "application " | TOPICAL_OIL | Status: DC | PRN
  Administered 2018-07-29: 1 via TOPICAL
  Filled 2018-07-28: qty 120

## 2018-07-28 MED ORDER — NALOXONE HCL 4 MG/10ML IJ SOLN
1.0000 ug/kg/h | INTRAVENOUS | Status: DC | PRN
  Filled 2018-07-28: qty 5

## 2018-07-28 SURGICAL SUPPLY — 28 items
BARRIER ADHS 3X4 INTERCEED (GAUZE/BANDAGES/DRESSINGS) ×3 IMPLANT
CANISTER SUCT 3000ML PPV (MISCELLANEOUS) ×3 IMPLANT
CHLORAPREP W/TINT 26ML (MISCELLANEOUS) ×3 IMPLANT
DRSG TELFA 3X8 NADH (GAUZE/BANDAGES/DRESSINGS) ×3 IMPLANT
ELECT CAUTERY BLADE 6.4 (BLADE) ×3 IMPLANT
ELECT REM PT RETURN 9FT ADLT (ELECTROSURGICAL) ×3
ELECTRODE REM PT RTRN 9FT ADLT (ELECTROSURGICAL) ×1 IMPLANT
GAUZE SPONGE 4X4 12PLY STRL (GAUZE/BANDAGES/DRESSINGS) ×3 IMPLANT
GLOVE BIO SURGEON STRL SZ8 (GLOVE) ×3 IMPLANT
GOWN STRL REUS W/ TWL LRG LVL3 (GOWN DISPOSABLE) ×2 IMPLANT
GOWN STRL REUS W/ TWL XL LVL3 (GOWN DISPOSABLE) ×1 IMPLANT
GOWN STRL REUS W/TWL LRG LVL3 (GOWN DISPOSABLE) ×4
GOWN STRL REUS W/TWL XL LVL3 (GOWN DISPOSABLE) ×2
NEEDLE HYPO 22GX1.5 SAFETY (NEEDLE) ×3 IMPLANT
NS IRRIG 1000ML POUR BTL (IV SOLUTION) ×3 IMPLANT
PACK C SECTION AR (MISCELLANEOUS) ×3 IMPLANT
PAD DRESSING TELFA 3X8 NADH (GAUZE/BANDAGES/DRESSINGS) ×1 IMPLANT
PAD OB MATERNITY 4.3X12.25 (PERSONAL CARE ITEMS) ×3 IMPLANT
PAD PREP 24X41 OB/GYN DISP (PERSONAL CARE ITEMS) ×3 IMPLANT
SPONGE LAP 18X18 RF (DISPOSABLE) ×2 IMPLANT
STAPLER INSORB 30 2030 C-SECTI (MISCELLANEOUS) ×2 IMPLANT
STRAP SAFETY 5IN WIDE (MISCELLANEOUS) ×3 IMPLANT
SUT CHROMIC 1 CTX 36 (SUTURE) ×9 IMPLANT
SUT PLAIN GUT 0 (SUTURE) ×6 IMPLANT
SUT VIC AB 0 CT1 36 (SUTURE) ×6 IMPLANT
SUT VIC AB 2-0 SH 27 (SUTURE) ×4
SUT VIC AB 2-0 SH 27XBRD (SUTURE) IMPLANT
SYR 30ML LL (SYRINGE) ×6 IMPLANT

## 2018-07-28 NOTE — Brief Op Note (Signed)
07/28/2018  4:56 PM  PATIENT:  [name]  32 y.o. female  PRE-OPERATIVE DIAGNOSIS:  Repeat c-section  POST-OPERATIVE DIAGNOSIS:  Repeat c-section  PROCEDURE:  Procedure(s) with comments: REPEAT CESAREAN SECTION (N/A) - Baby Girl born @ [id number] Apgars: 9/10 Weight: 6lbs 1oz  SURGEON:  Surgeon(s) and Role:    * Schermerhorn, Ihor Austinhomas J, MD - Primary  PHYSICIAN ASSISTANT: M Havliand , CNM   ASSISTANTS: none   ANESTHESIA:   spinal  EBL:  700 mL   BLOOD ADMINISTERED:none  DRAINS: Urinary Catheter (Foley)   LOCAL MEDICATIONS USED:  MARCAINE    and BUPIVICAINE   SPECIMEN:  No Specimen  DISPOSITION OF SPECIMEN:  N/A  COUNTS:  YES  TOURNIQUET:  * No tourniquets in log *  DICTATION: .Other Dictation: Dictation Number verbal  PLAN OF CARE: Admit to inpatient   PATIENT DISPOSITION:  PACU - hemodynamically stable.   Delay start of Pharmacological VTE agent (>24hrs) due to surgical blood loss or risk of bleeding: not applicable

## 2018-07-28 NOTE — Progress Notes (Signed)
Patient ID: [name], female   DOB: 04-17-1986, 32 y.o.   MRN: [id number] Ready to proceed with LTCS , declines BTL . All questions answered

## 2018-07-28 NOTE — Transfer of Care (Signed)
Immediate Anesthesia Transfer of Care Note  Patient: [name]  Procedure(s) Performed: REPEAT CESAREAN SECTION (N/A Abdomen)  Patient Location: PACU  Anesthesia Type:Spinal  Level of Consciousness: awake, alert , oriented and patient cooperative  Airway & Oxygen Therapy: Patient Spontanous Breathing  Post-op Assessment: Report given to RN, Post -op Vital signs reviewed and stable and Patient moving all extremities  Post vital signs: Reviewed and stable  Last Vitals:  Vitals Value Taken Time  BP 122/78 07/28/2018  3:23 PM  Temp 36.5 C 07/28/2018  3:23 PM  Pulse 59 07/28/2018  3:23 PM  Resp 17 07/28/2018  3:23 PM  SpO2 99 % 07/28/2018  3:23 PM    Last Pain:  Vitals:   07/28/18 1523  TempSrc: Oral  PainSc: 0-No pain         Complications: No apparent anesthesia complications

## 2018-07-28 NOTE — Op Note (Signed)
[name]: [name], [name] MEDICAL RECORD [id number] ACCOUNT [id number] DATE OF BIRTH:01-03-1986 FACILITY: [facility] LOCATION: [facility] PHYSICIAN:Riyah Bardon Cloyde ReamsJ. Jameriah Trotti, MD  OPERATIVE REPORT  DATE OF PROCEDURE:  07/28/2018  PREOPERATIVE DIAGNOSIS:  Elective repeat cesarean section, estimated gestational age 30+3 weeks.  POSTOPERATIVE DIAGNOSIS:  Elective repeat cesarean section, estimated gestational age 30+3 weeks.  PROCEDURE:  Repeat low transverse cesarean section.  ANESTHESIA:  Spinal.  SURGEON:  Jennell Cornerhomas Shauntee Karp, MD  FIRST ASSISTANT:  Genia DelMargaret Haviland, certified nurse midwife.  INDICATIONS:  A 32 year old gravida 5, para 3 patient with a prior history of cesarean section elects for a repeat cesarean section.  The patient declines bilateral tubal ligation.  DESCRIPTION OF PROCEDURE:  After adequate spinal anesthesia, the patient was placed in dorsal supine position, hip roll on the right side.  The patient's abdomen was prepped and draped in normal sterile fashion.  Timeout was performed.  The patient did  receive 2 g IV Ancef prior to commencement of the case.  A Pfannenstiel incision was made 2 fingerbreadths above the symphysis pubis.  Sharp dissection was used to identify the fascia.  The fascia was opened in the midline and opened in a transverse  fashion.  The superior aspect of the fascia was grasped with Kocher clamps, and the recti muscles were dissected free.  There was some brisk bleeding to the left lateral aspect of the left recti muscle, which was controlled with 2 figure-of-eight 2-0  Vicryl sutures.  Inferior aspect of the fascia was grasped with Kocher clamps.  Pyramidalis muscle was dissected free.  Entry into the peritoneal cavity was accomplished sharply.  There was a small amount of peritoneal tissue that was adherent to the  lower uterine segment which was taken down sharply.  The vesicouterine peritoneal fold was developed, and the bladder was reflected  inferiorly.  Low transverse uterine incision was made upon entry into the endometrial cavity.  Clear fluid resulted.  The  incision was extended with blunt transverse traction.  Fetal head was brought to the incision with fundal pressure.  The head was delivered, followed by the shoulders and the body.  A vigorous female was then dried on the abdomen for 60 seconds.  Time of  birth [id number] on 07/28/2018.  Apgars assigned by neonatology, 9 and 10.  Fetal weight 6 pounds 1 ounce.  The placenta was manually delivered, and the uterus was exteriorized.  The endometrial cavity was wiped clean with laparotomy tape.  A ring forceps was  used to open the cervix, and this was passed off the operative field.  Uterine incision was closed with #1 chromic suture in a running locking fashion.  Two additional figure-of-eights were required for good hemostasis.  Fallopian tubes and ovaries  appeared normal.  Posterior cul-de-sac was irrigated and suctioned.  Uterus was placed back into the abdominal cavity, and the pericolic gutters were wiped clean with laparotomy tape.  Uterine incision appeared hemostatic.  The aforementioned left  lateral recti area was hemostatic before closing the fascia.  The fascia was closed with 0 Vicryl suture in a running nonlocking fashion.  Good approximation of edges.  Subcutaneous tissues were irrigated and bovied for hemostasis.  Of note, Interceed  was placed on the uterine incision in a T-shaped fashion before closure of the fascia.  The fascia was then injected with a solution of 20 mL of 1.3% Exparel with 30 mL of 0.5% Marcaine and 50 mL normal saline.  Sixty mL were used to inject the fascial  tissues.  The skin  was then reapproximated with Insorb absorbable staples.  Good cosmetic effect.  Additional 30 mL of the Exparel solution was injected subcuticularly.  The patient tolerated the procedure well.  ESTIMATED BLOOD LOSS:  700 mL.  INTRAOPERATIVE FLUIDS:  1500 mL.  URINE OUTPUT:   150 mL.  There were no complications.  The patient was taken to recovery room in good condition.  LN/NUANCE  D:07/28/2018 T:07/28/2018 JOB:[id number]

## 2018-07-28 NOTE — Anesthesia Procedure Notes (Signed)
Spinal  Patient location during procedure: OR Staffing Anesthesiologist: Gunnar Fusi, MD Resident/CRNA: Demetrius Charity, CRNA Performed: resident/CRNA  Preanesthetic Checklist Completed: patient identified, site marked, surgical consent, pre-op evaluation, timeout performed, IV checked, risks and benefits discussed and monitors and equipment checked Spinal Block Patient position: sitting Prep: Betadine Patient monitoring: heart rate, continuous pulse ox, blood pressure and cardiac monitor Approach: midline Location: L4-5 Injection technique: single-shot Needle Needle type: Whitacre and Introducer  Needle gauge: 25 G Needle length: 9 cm Assessment Sensory level: T4 Additional Notes Negative paresthesia. Negative blood return. Positive free-flowing CSF. Expiration date of kit checked and confirmed. Patient tolerated procedure well, without complications.

## 2018-07-28 NOTE — Discharge Summary (Signed)
Obstetric Discharge Summary   Patient ID: Patient Name: [name] Allende DOB: [birth year] MRN: [id number]  Date of Admission: 07/28/2018 Date of Delivery: 07/28/2018 at 1441 Delivered by: schermerhorn Date of Discharge: 07/29/18 [facility]  WUJ:[id number]'WLMP:Patient's last menstrual period was 11/04/2017. EDC Estimated Date of Delivery: 08/01/18 Gestational Age at Delivery: 8966w3d   Antepartum complications: + MJ use , + tobacco , + asthma Admitting Diagnosis: +mj  Secondary Diagnoses: Patient Active Problem List   Diagnosis Date Noted  . Previous cesarean delivery affecting pregnancy 07/28/2018  . Postoperative state 07/28/2018  . Uterine contractions during pregnancy 07/14/2018  . Mild intermittent asthma 03/19/2017  . Pregnancy, supervision, high-risk, third trimester 03/17/2017  . Abruptio placenta, third trimester 03/17/2017  . [redacted] weeks gestation of pregnancy 03/17/2017  . Marijuana use 03/17/2017    Augmentation: none Complications: None Intrapartum complications/course: none Delivery Type: repeat cesarean section, low transverse incision Anesthesia: CLE Placenta: manual Laceration: none Episiotomy: n/a Newborn Data:female born [id number] on 07/28/18 APGARS 9/10 weight #6/1 oz       Postpartum Course  Patient had an uncomplicated postpartum course.  By time of discharge on POD#1, her pain was controlled on oral pain medications; she had appropriate lochia and was ambulating, voiding without difficulty and tolerating regular diet.  She was deemed stable for discharge to home.       Labs: CBC Latest Ref Rng & Units 07/29/2018 07/27/2018 01/19/2018  WBC 3.6 - 11.0 K/uL 15.1(H) 10.0 10.0  Hemoglobin 12.0 - 16.0 g/dL 2.9(F8.8(L) 11.4(L) 12.2  Hematocrit 35.0 - 47.0 % 26.3(L) 33.5(L) 36.9  Platelets 150 - 440 K/uL 131(L) 135(L) 187   O POS  Physical exam:  BP 137/88 (BP Location: Right Arm)   Pulse 71   Temp 98.1 F (36.7 C) (Oral)   Resp 18   Ht 5\' 7"  (1.702 m)   Wt 69.9 kg    LMP 11/04/2017   SpO2 100%   BMI 24.12 kg/m  General: alert and no distress Pulm: normal respiratory effort Lochia: appropriate Abdomen: soft, NT Uterine Fundus: firm, below umbilicus Incision: c/d/i, healing well, no significant drainage, no dehiscence, no significant erythema Extremities: No evidence of DVT seen on physical exam. No lower extremity edema.   Disposition: stable, discharge to home Baby Feeding: formula Baby Disposition: home with mom  Contraception: TBD  Prenatal Labs:  ABO, Rh:  O+ Antibody:  neg  Rubella:  IMM, varicella Imm RPR:   nr HBsAg:   neg HIV:   neg GBS:   unknown   Rh Immune globulin given: n/a  Plan:  [name] Rasp was discharged to home in good condition. [facility] delivery provider in 2 weeks  Discharge Instructions: Per After Visit Summary. Activity: Advance as tolerated. Pelvic rest for 6 weeks.   Diet: Regular Discharge Medications: Allergies as of 07/29/2018      Reactions   Peanut-containing Drug Products Anaphylaxis, Hives   Demerol [meperidine] Hives      Medication List    TAKE these medications   ibuprofen 600 MG tablet Commonly known as:  ADVIL,MOTRIN Take 1 tablet (600 mg total) by mouth every 6 (six) hours.   oxyCODONE-acetaminophen 5-325 MG tablet Commonly known as:  PERCOCET/ROXICET Take 1 tablet by mouth every 4 (four) hours as needed (pain scale 4-7).            Discharge Care Instructions  (From admission, onward)         Start     Ordered  07/29/18 0000  Discharge wound care:    Comments:  Keep incision dry, clean.   07/29/18 2117         Outpatient follow up:  Follow-up Information    Schermerhorn, Ihor Austin, MD Follow up in 2 week(s).   Specialty:  Obstetrics and Gynecology Contact information: [address] [place] [address] [phone]            Signed:  Elenora Fender Paisely Brick

## 2018-07-28 NOTE — Lactation Note (Signed)
This note was copied from a baby's chart. Lactation Consultation Note  Patient Name: [name] Today's Date: 07/28/2018 Reason for consult: Initial assessment   Maternal Data    Feeding Feeding Type: Breast Fed  LATCH Score Latch: Grasps breast easily, tongue down, lips flanged, rhythmical sucking.  Audible Swallowing: None  Type of Nipple: Everted at rest and after stimulation  Comfort (Breast/Nipple): Soft / non-tender  Hold (Positioning): Assistance needed to correctly position infant at breast and maintain latch.  LATCH Score: 7  Interventions Interventions: Breast feeding basics reviewed;Assisted with latch;Skin to skin;Support pillows  Lactation Tools Discussed/Used     Consult Status  Mother is interested in breastfeeding. She has 3 older children. Mother states that she breastfed her last child for 2-3 months before she states her "milk dried up." Mother has history of THC use during this  pregnancy. LC explained the risks of marijuana, discouraged the use while breastfeeding and gave mother a copy of ABM protocol 21 which also explains the risks of marijuana and substance abuse while breastfeeding. Mother verbalized understanding and states that she is trying to make healthier lifestyle choices and is also trying to quit smoking cigarettes. Mother states that she would really like to breastfeed. LC assisted with latch of infant to the breast. Infant was able to latch to the left breast successfully. Mother was taught hand expression and was able to express colostrum. Mother was also educated on the benefits of skin to skin.    [name] 07/28/2018, 5:06 PM

## 2018-07-28 NOTE — Anesthesia Post-op Follow-up Note (Signed)
Anesthesia QCDR form completed.        

## 2018-07-29 ENCOUNTER — Encounter: Payer: Self-pay | Admitting: Obstetrics and Gynecology

## 2018-07-29 LAB — CBC
HEMATOCRIT: 26.3 % — AB (ref 35.0–47.0)
HEMOGLOBIN: 8.8 g/dL — AB (ref 12.0–16.0)
MCH: 28.9 pg (ref 26.0–34.0)
MCHC: 33.4 g/dL (ref 32.0–36.0)
MCV: 86.6 fL (ref 80.0–100.0)
Platelets: 131 10*3/uL — ABNORMAL LOW (ref 150–440)
RBC: 3.03 MIL/uL — ABNORMAL LOW (ref 3.80–5.20)
RDW: 14.1 % (ref 11.5–14.5)
WBC: 15.1 10*3/uL — AB (ref 3.6–11.0)

## 2018-07-29 MED ORDER — OXYCODONE-ACETAMINOPHEN 5-325 MG PO TABS: 1.0000 | ORAL_TABLET | ORAL | 0 refills | Status: DC | PRN

## 2018-07-29 MED ORDER — IBUPROFEN 600 MG PO TABS: 600.0000 mg | ORAL_TABLET | Freq: Four times a day (QID) | ORAL | 1 refills | Status: DC

## 2018-07-29 NOTE — Anesthesia Post-op Follow-up Note (Signed)
  Anesthesia Pain Follow-up Note  Patient: [name]  Day #: 1  Date of Follow-up: 07/29/2018 Time: 7:33 AM  Last Vitals:  Vitals:   07/28/18 2330 07/29/18 0446  BP: 116/61 122/78  Pulse: (!) 52 61  Resp: 18 18  Temp: 36.8 C 36.7 C  SpO2: 100% 100%    Level of Consciousness: alert  Pain: none   Side Effects:None  Catheter Site Exam:clean, dry, no drainage     Plan: D/C from anesthesia care at surgeon's request  Karoline Caldwelleana Leolia Vinzant

## 2018-07-29 NOTE — Progress Notes (Signed)
  Subjective:   Doing well.  No complaints.  Voiding, ambulating, tolerating regular PO diet, tolerating pain with PO meds. Denies: CP SOB F/C, N/V, calf pain    Objective:  Blood pressure 101/80, pulse (!) 54, temperature 98.4 F (36.9 C), temperature source Oral, resp. rate 20, height 5\' 7"  (1.702 m), weight 69.9 kg (154 lb), last menstrual period 11/04/2017, SpO2 100 %,  General: NAD Pulmonary: no increased work of breathing Abdomen: non-distended, non-tender, fundus firm at level of umbilicus Incision: intact, slight bloody drainage from left near apex.   Extremities: no edema, no erythema, no tenderness  Results for orders placed or performed during the hospital encounter of 07/28/18 (from the past 24 hour(s))  CBC     Status: Abnormal   Collection Time: 07/29/18  9:56 AM  Result Value Ref Range   WBC 15.1 (H) 3.6 - 11.0 K/uL   RBC 3.03 (L) 3.80 - 5.20 MIL/uL   Hemoglobin 8.8 (L) 12.0 - 16.0 g/dL   HCT 82.926.3 (L) 56.235.0 - 13.047.0 %   MCV 86.6 80.0 - 100.0 fL   MCH 28.9 26.0 - 34.0 pg   MCHC 33.4 32.0 - 36.0 g/dL   RDW 86.514.1 78.411.5 - 69.614.5 %   Platelets 131 (L) 150 - 440 K/uL    Intake/Output Summary (Last 24 hours) at 07/29/2018 1324 Last data filed at 07/29/2018 1200 Gross per 24 hour  Intake 900 ml  Output 3243 ml  Net -2343 ml      Assessment:   32 y.o. [id number] postoperativeday # 1 from LTCS repeat.   Plan:  1) Acute blood loss anemia - hemodynamically stable and asymptomatic - po ferrous sulfate BID  2. Abdominal binder and abd pad over incision  3. Patient considering discharge today.   Will let me know after evaluation of infant.  ----- Ranae Plumberhelsea Ward, MD Attending Obstetrician and Gynecologist [facility], Department of OB/GYN [facility]

## 2018-07-29 NOTE — Anesthesia Postprocedure Evaluation (Signed)
Anesthesia Post Note  Patient: [name]  Procedure(s) Performed: REPEAT CESAREAN SECTION (N/A Abdomen)  Patient location during evaluation: Mother Baby Anesthesia Type: Spinal Level of consciousness: awake, awake and alert and oriented Pain management: pain level controlled Vital Signs Assessment: post-procedure vital signs reviewed and stable Respiratory status: spontaneous breathing Cardiovascular status: blood pressure returned to baseline Postop Assessment: no headache, no backache and able to ambulate Anesthetic complications: no     Last Vitals:  Vitals:   07/28/18 2330 07/29/18 0446  BP: 116/61 122/78  Pulse: (!) 52 61  Resp: 18 18  Temp: 36.8 C 36.7 C  SpO2: 100% 100%    Last Pain:  Vitals:   07/29/18 0446  TempSrc: Oral  PainSc:                  Karoline Caldwelleana Charelle Petrakis

## 2018-07-29 NOTE — Progress Notes (Signed)
pt discharged home with infant.  Discharge instructions, prescriptions and follow up appointment given to and reviewed with pt.  Pt verbalized understanding, all questions answered.  Escorted by auxiliary. 

## 2018-07-29 NOTE — Discharge Instructions (Signed)
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
## 2018-07-29 NOTE — Lactation Note (Signed)
This note was copied from a baby's chart. Lactation Consultation Note  Patient Name: [name] Today's Date: 07/29/2018     Maternal Data    Feeding    LATCH Score                   Interventions    Lactation Tools Discussed/Used     Consult Status  LC to mother's room to check the progress of breastfeeding. Mother states that breastfeeding is going well and she plans to both breast and bottle feed. Mother states that infant is latching well and denies any pain or concerns at this time. Mother states that she will call out if experiencing concerns. LC left information about lactation outpatient clinic.    [name] 07/29/2018, 11:37 AM

## 2018-07-30 NOTE — Anesthesia Preprocedure Evaluation (Signed)
Anesthesia Evaluation  Patient identified by MRN, date of birth, ID band Patient awake    Reviewed: Allergy & Precautions, NPO status , Patient's Chart, lab work & pertinent test results  History of Anesthesia Complications Negative for: history of anesthetic complications  Airway Mallampati: II       Dental   Pulmonary asthma , Current Smoker,           Cardiovascular (-) hypertension(-) Past MI and (-) Orthopnea (-) dysrhythmias (-) Valvular Problems/Murmurs     Neuro/Psych neg Seizures    GI/Hepatic Neg liver ROS, neg GERD  ,  Endo/Other  neg diabetes  Renal/GU negative Renal ROS     Musculoskeletal   Abdominal   Peds  Hematology   Anesthesia Other Findings   Reproductive/Obstetrics                             Anesthesia Physical Anesthesia Plan  ASA: II  Anesthesia Plan: Spinal   Post-op Pain Management:    Induction:   PONV Risk Score and Plan:   Airway Management Planned:   Additional Equipment:   Intra-op Plan:   Post-operative Plan:   Informed Consent: I have reviewed the patients History and Physical, chart, labs and discussed the procedure including the risks, benefits and alternatives for the proposed anesthesia with the patient or authorized representative who has indicated his/her understanding and acceptance.     Plan Discussed with:   Anesthesia Plan Comments:         Anesthesia Quick Evaluation

## 2018-10-24 ENCOUNTER — Encounter (HOSPITAL_COMMUNITY): Payer: Self-pay

## 2019-01-22 ENCOUNTER — Other Ambulatory Visit: Payer: Self-pay | Admitting: Family Medicine

## 2019-01-22 ENCOUNTER — Other Ambulatory Visit (HOSPITAL_COMMUNITY): Payer: Self-pay | Admitting: Family Medicine

## 2019-01-22 DIAGNOSIS — O0990 Supervision of high risk pregnancy, unspecified, unspecified trimester: Secondary | ICD-10-CM

## 2019-01-26 ENCOUNTER — Other Ambulatory Visit: Payer: Self-pay | Admitting: Family Medicine

## 2019-01-26 DIAGNOSIS — Z3481 Encounter for supervision of other normal pregnancy, first trimester: Secondary | ICD-10-CM

## 2019-01-27 ENCOUNTER — Ambulatory Visit: Admission: RE | Admit: 2019-01-27 | Payer: Medicaid Other | Source: Ambulatory Visit

## 2019-02-02 ENCOUNTER — Ambulatory Visit
Admission: RE | Admit: 2019-02-02 | Discharge: 2019-02-02 | Disposition: A | Discharge status: Home / Self Care | Payer: Medicaid Other | Source: Ambulatory Visit | Attending: Family Medicine | Admitting: Family Medicine

## 2019-02-02 DIAGNOSIS — Z3481 Encounter for supervision of other normal pregnancy, first trimester: Secondary | ICD-10-CM | POA: Insufficient documentation

## 2019-03-17 LAB — OB RESULTS CONSOLE RPR: RPR: NONREACTIVE

## 2019-03-17 LAB — OB RESULTS CONSOLE HIV ANTIBODY (ROUTINE TESTING): HIV: NONREACTIVE

## 2019-03-17 LAB — OB RESULTS CONSOLE HEPATITIS B SURFACE ANTIGEN: Hepatitis B Surface Ag: NEGATIVE

## 2019-03-17 LAB — OB RESULTS CONSOLE VARICELLA ZOSTER ANTIBODY, IGG: Varicella: IMMUNE

## 2019-03-17 LAB — OB RESULTS CONSOLE RUBELLA ANTIBODY, IGM: Rubella: IMMUNE

## 2019-04-22 IMAGING — US US OB COMP LESS 14 WK
1 series · 14 of 28 positions shown · non-contrast
Comparison: None.

CLINICAL DATA: Spotting and abdominal pain. Estimated gestational
age by LMP is 10 weeks 6 days. Quantitative beta HCG is [DATE].

EXAM:
OBSTETRIC <14 WK ULTRASOUND
TECHNIQUE: Transabdominal ultrasound was performed for evaluation of the
gestation as well as the maternal uterus and adnexal regions.

[Series 1: us ob comp less 14 wk · 0.23mm/px · 14 of 58 slices shown]
[im 3/58]
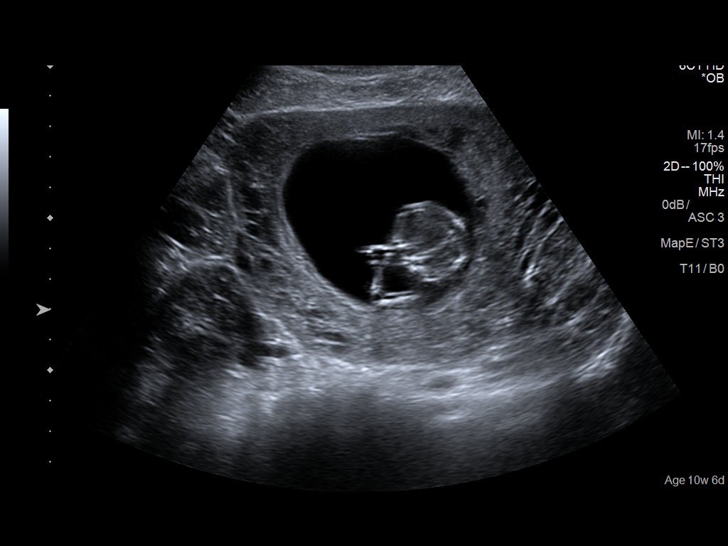
[im 7/58]
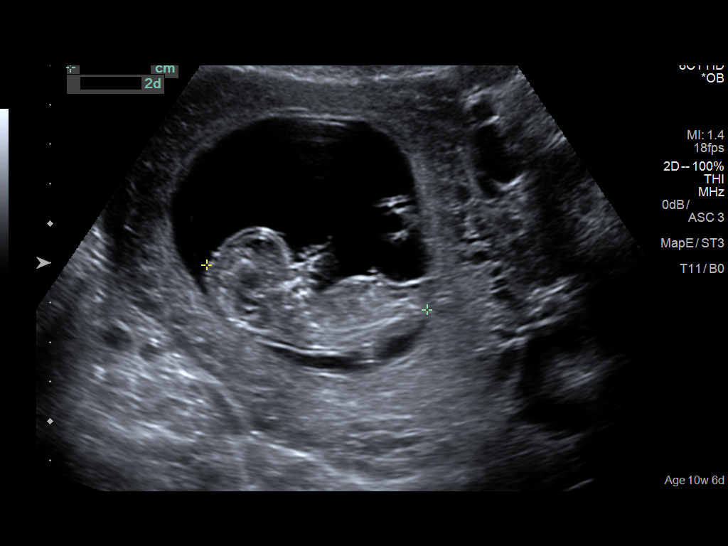
[im 11/58]
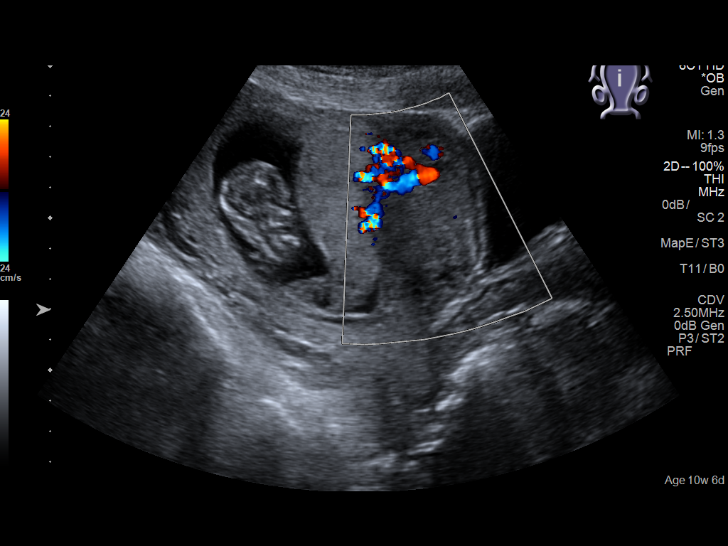
[im 15/58]
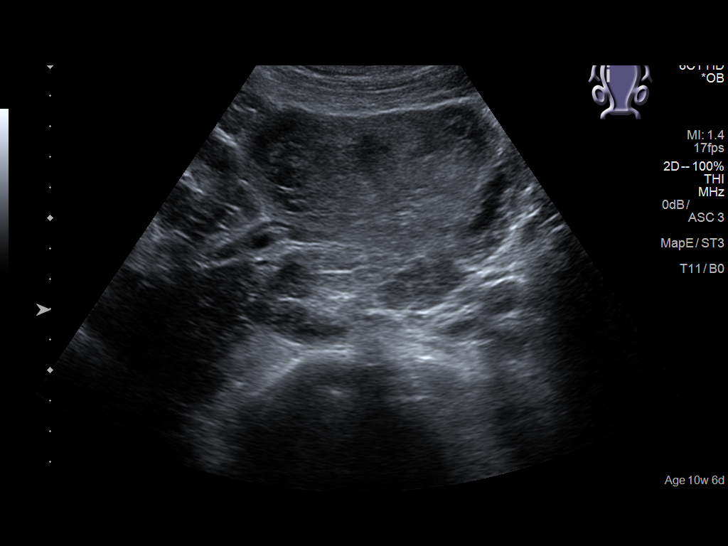
[im 20/58]
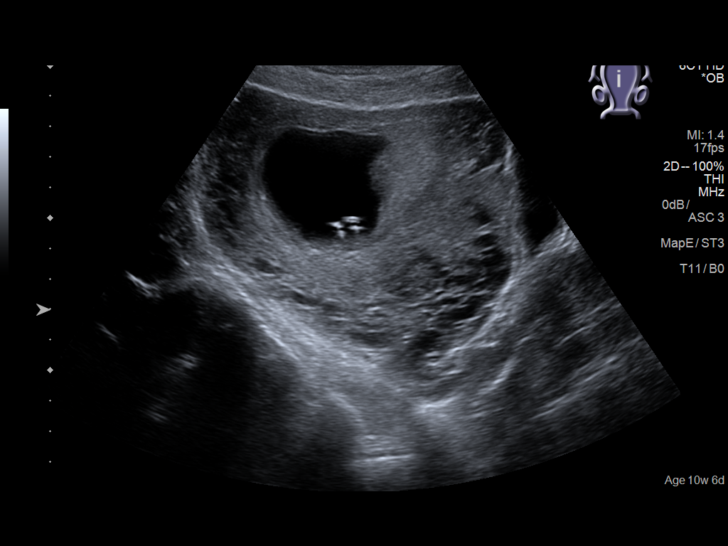
[im 24/58]
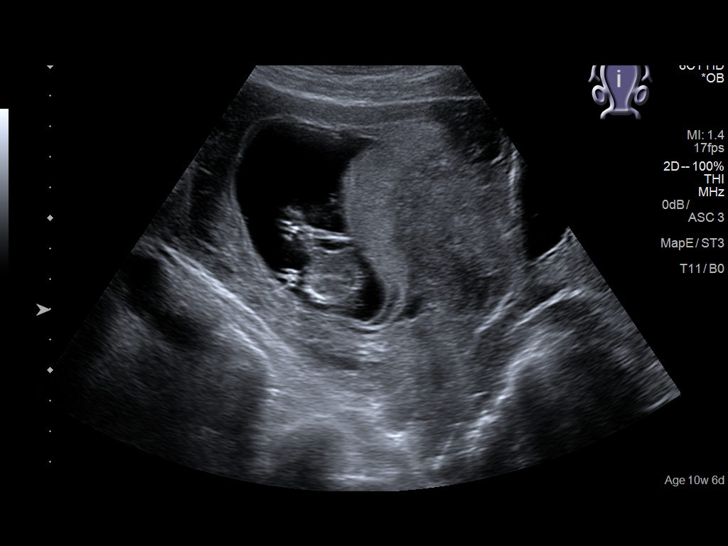
[im 28/58]
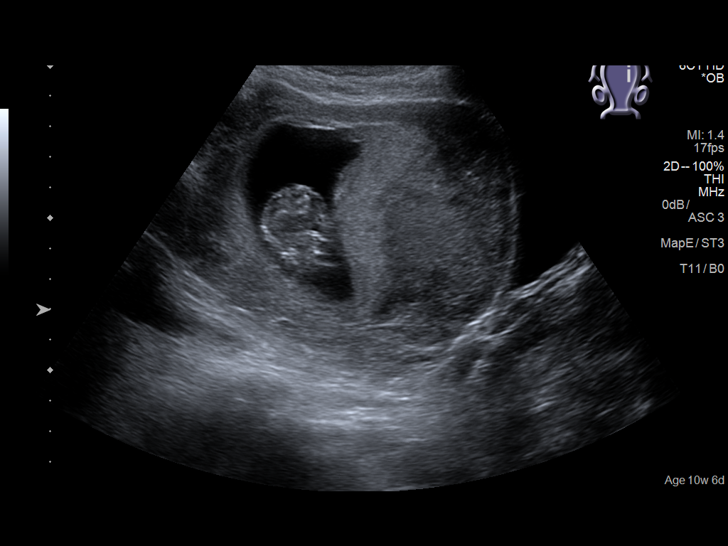
[im 32/58]
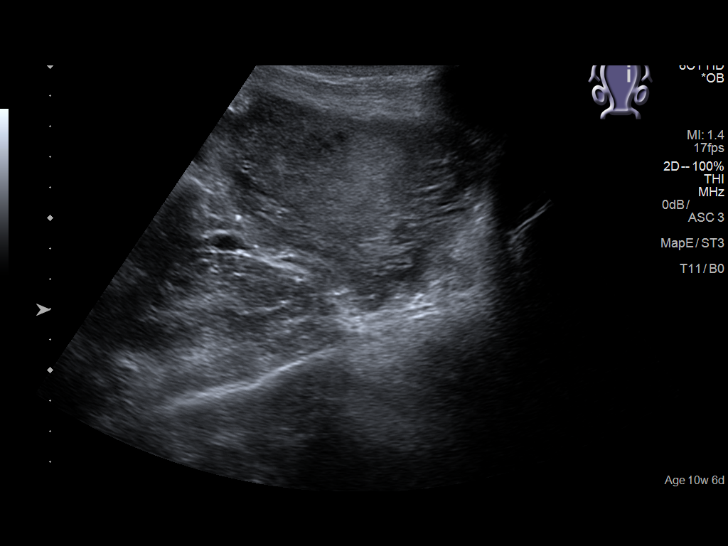
[im 36/58]
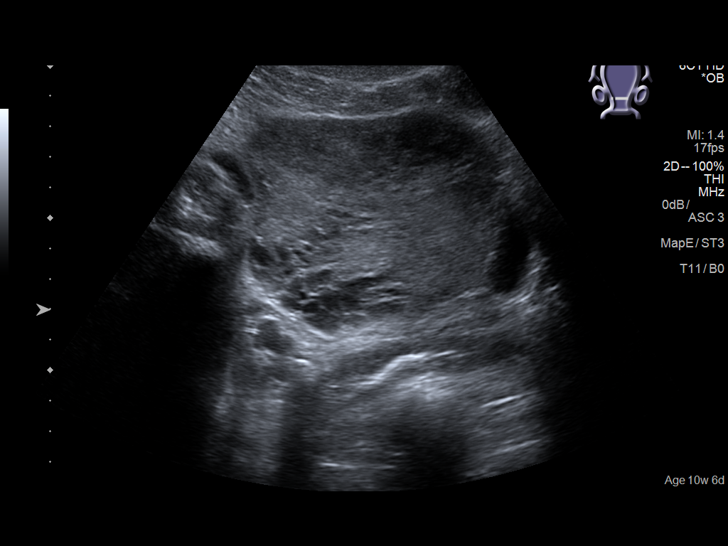
[im 41/58]
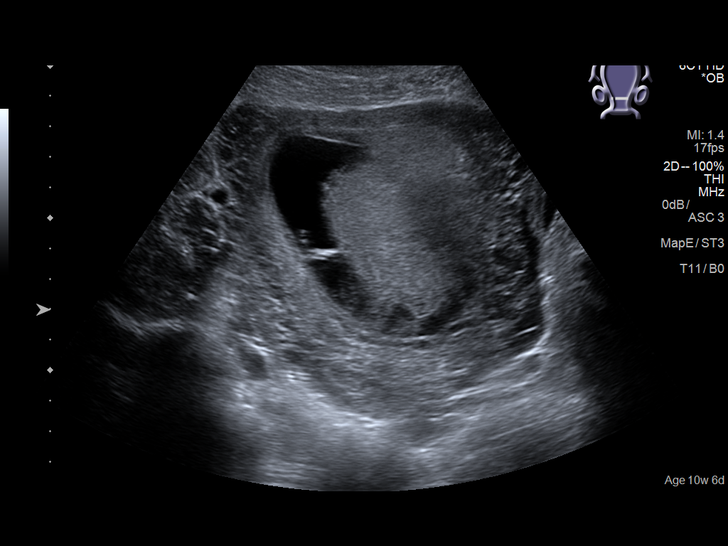
[im 45/58]
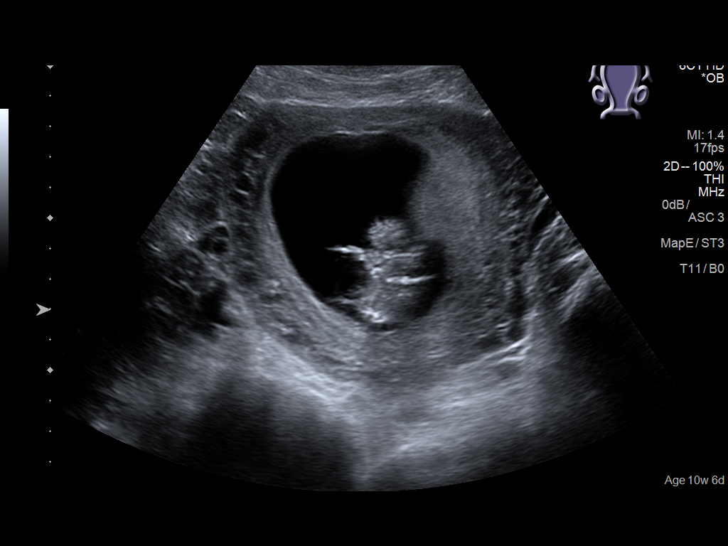
[im 49/58]
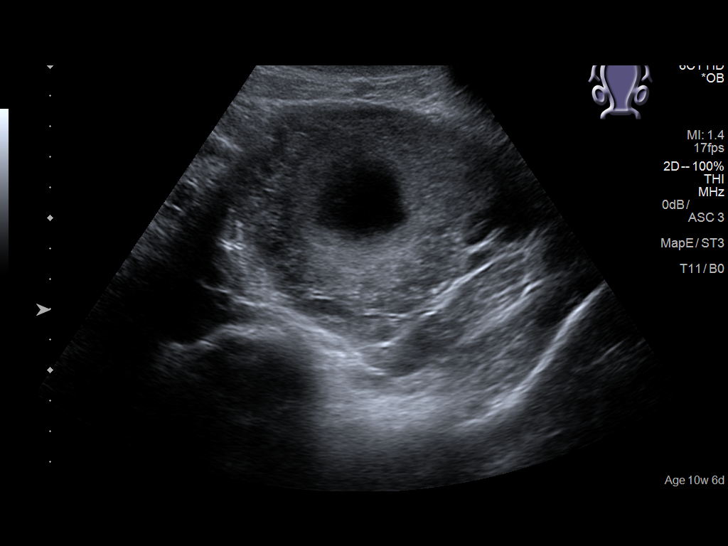
[im 53/58]
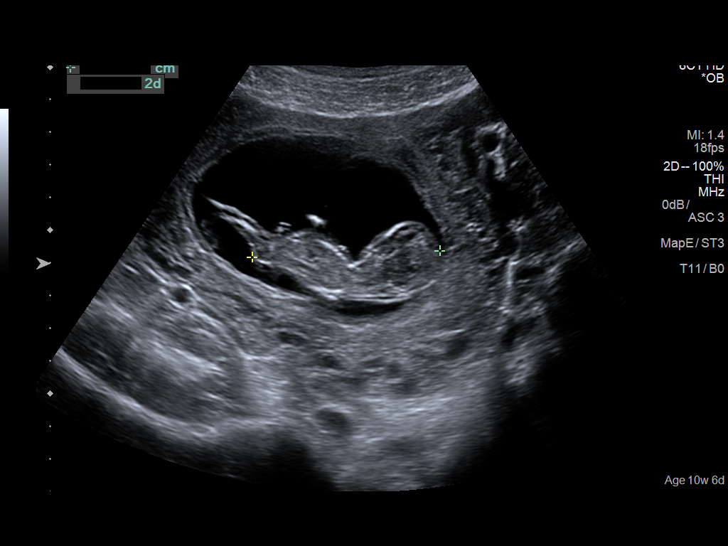
[im 58/58]
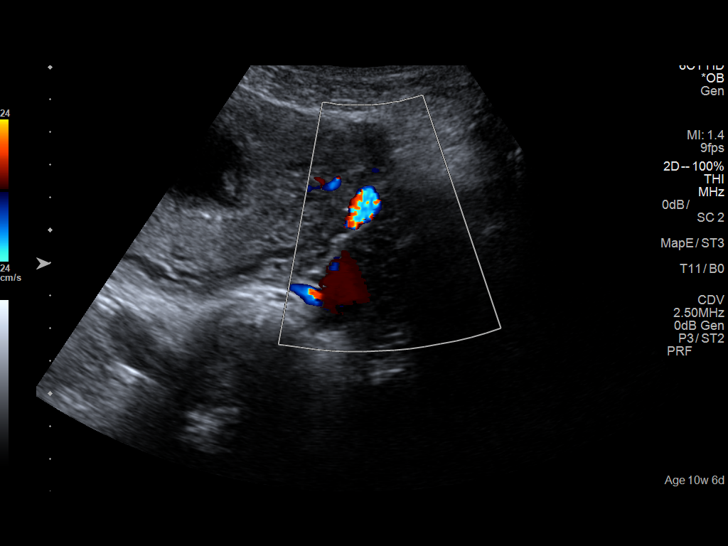

[14 of 28 positions shown; findings below may reference images not displayed]

FINDINGS: Intrauterine gestational sac: A single intrauterine pregnancy is
identified.

Yolk sac:  Yolk sac is visualized.

Embryo:  Fetal pole is present.

Cardiac Activity: Fetal cardiac activity is observed.

Heart Rate: 141 bpm

CRL: 57 mm   12 w 2 d                  US EDC: 08/01/2018

Subchorionic hemorrhage: A small inferior subchorionic hemorrhage is
demonstrated.

Maternal uterus/adnexae: Uterus is anteverted. No myometrial mass
lesions are identified. Ovaries are not visualized. No adnexal
masses seen. No free fluid in the pelvis.
IMPRESSION: Single living intrauterine pregnancy. Estimated gestational age by
crown-rump length is 12 weeks 2 days. Small subchorionic hemorrhage
is identified.

## 2019-05-27 ENCOUNTER — Encounter: Payer: Self-pay | Admitting: Certified Nurse Midwife

## 2019-07-22 LAB — OB RESULTS CONSOLE GBS: GBS: POSITIVE

## 2019-07-22 LAB — OB RESULTS CONSOLE GC/CHLAMYDIA
Chlamydia: NEGATIVE
Gonorrhea: NEGATIVE

## 2019-08-04 NOTE — H&P (Signed)
[name] is a 33 y.o. female presenting for repeat elective LTCS  + BTL . Pt is s/p 2 prior LTCS . EDC 08/20/2019. 39+3 weeks based on LMP .  PPregnancy complicated by tobacco use and MJ use. OB History    Gravida  5   Para  4   Term  3   Preterm  1   AB  1   Living  3     SAB  1   TAB      Ectopic      Multiple      Live Births             Past Medical History:  Diagnosis Date  . Asthma   . Cervical cyst    Past Surgical History:  Procedure Laterality Date  . CESAREAN SECTION N/A 03/17/2017   Procedure: CESAREAN SECTION;  Surgeon: Will Bonnet, MD;  Location: [facility] ORS;  Service: Obstetrics;  Laterality: N/A;  . CESAREAN SECTION N/A 07/28/2018   Procedure: REPEAT CESAREAN SECTION;  Surgeon: Ouida Sills, Gwen Her, MD;  Location: [facility] ORS;  Service: Obstetrics;  Laterality: N/A;  Baby Girl born @ 88 Apgars: 9/10 Weight: 6lbs 1oz   Family History: family history is not on file. Social History:  reports that she has been smoking cigarettes. She has been smoking about 0.25 packs per day. She has never used smokeless tobacco. She reports current drug use. Drug: Marijuana. She reports that she does not drink alcohol.     Maternal Diabetes: No Genetic Screening: Normal Maternal Ultrasounds/Referrals: Normal Fetal Ultrasounds or other Referrals:  None Maternal Substance Abuse:  Yes:  Type: Smoker, Marijuana Significant Maternal Medications:  None Significant Maternal Lab Results:  Group B Strep positive Other Comments:  None  ROS Review of Systems: A full review of systems was performed and negative except as noted in the HPI.   Eyes: no vision change  Ears: left ear pain  Oropharynx: no sore throat  Pulmonary . No shortness of breath , no hemoptysis Cardiovascular: no chest pain , no irregular heart beat  Gastrointestinal:no blood in stool . No diarrhea, no constipation Uro gynecologic: no dysuria , no pelvic pain Neurologic : no seizure , no  migraines    Musculoskeletal: no muscular weakness History   unknown if currently breastfeeding. Exam Physical Exam   Lungs CTA   CV RRR  abd : gravid  Prenatal labs: ABO, Rh:  O+ Antibody:  neg Rubella:   immune vz Imm RPR:   neg HBsAg:   nr HIV:   neg GBS:  + Covid[ ]   Assessment/Plan:elective repeat c/s and BTL on 08/16/2019  Risks of the procedure have been have been discuss with the pt .   Gwen Her  08/04/2019, 5:37 PM

## 2019-08-09 ENCOUNTER — Inpatient Hospital Stay
Admission: RE | Admit: 2019-08-09 | Discharge: 2019-08-09 | Disposition: A | Discharge status: Home / Self Care | Payer: Medicaid Other | Source: Ambulatory Visit

## 2019-08-09 HISTORY — DX: Depression, unspecified: F32.A

## 2019-08-09 HISTORY — DX: Anxiety disorder, unspecified: F41.9

## 2019-08-09 HISTORY — DX: Anemia, unspecified: D64.9

## 2019-08-10 ENCOUNTER — Encounter: Payer: Self-pay | Admitting: *Deleted

## 2019-08-10 ENCOUNTER — Other Ambulatory Visit: Payer: Self-pay

## 2019-08-10 ENCOUNTER — Encounter
Admission: RE | Admit: 2019-08-10 | Discharge: 2019-08-10 | Disposition: A | Discharge status: Home / Self Care | Payer: Medicaid Other | Source: Ambulatory Visit | Attending: Obstetrics and Gynecology | Admitting: Obstetrics and Gynecology

## 2019-08-10 DIAGNOSIS — Z01812 Encounter for preprocedural laboratory examination: Secondary | ICD-10-CM | POA: Diagnosis present

## 2019-08-10 NOTE — Patient Instructions (Signed)
Your procedure is scheduled on: Mon. 8/24  At 5:45 AM Report to [place] Call [phone]   You will be escorted to L&D .  Remember: Instructions that are not followed completely may result in serious medical risk,  up to and including death, or upon the discretion of your surgeon and anesthesiologist your  surgery may need to be rescheduled.     _X__ 1. Do not eat food after midnight the night before your procedure.                 No gum chewing or hard candies. You may drink clear liquids up to 2 hours                 before you are scheduled to arrive for your surgery- DO not drink clear                 liquids within 2 hours of the start of your surgery.                 Clear Liquids include:  water, apple juice without pulp, clear carbohydrate                 drink such as Clearfast of Gatorade, Black Coffee or Tea (Do not add                 anything to coffee or tea).  __X__2.  On the morning of surgery brush your teeth with toothpaste and water, you                may rinse your mouth with mouthwash if you wish.  Do not swallow any toothpaste of mouthwash.     _X__ 3.  No Alcohol for 24 hours before or after surgery.   _X__ 4.  Do Not Smoke or use e-cigarettes For 24 Hours Prior to Your Surgery.                 Do not use any chewable tobacco products for at least 6 hours prior to                 surgery.  ____  5.  Bring all medications with you on the day of surgery if instructed.   _x___  6.  Notify your doctor if there is any change in your medical condition      (cold, fever, infections).     Do not wear jewelry, make-up, hairpins, clips or nail polish. Do not wear lotions, powders, or perfumes. You may wear deodorant. Do not shave 48 hours prior to surgery. Men may shave face and neck. Do not bring valuables to the hospital.    [facility] is not responsible for any belongings or valuables.  Contacts, dentures or bridgework may not be  worn into surgery. Leave your suitcase in the car. After surgery it may be brought to your room. For patients admitted to the hospital, discharge time is determined by your treatment team.   Patients discharged the day of surgery will not be allowed to drive home.   Please read over the following fact sheets that you were given:    ____ Take these medicines the morning of surgery with A SIP OF WATER:    1. none  2.   3.   4.  5.  6.  ____ Fleet Enema (as directed)   _x___ Use CHG Soap as directed  ____ Use inhalers on the day of surgery  ____ Stop metformin 2 days prior to surgery    ____ Take 1/2 of usual insulin dose the night before surgery. No insulin the morning          of surgery.   ____ Stop Coumadin/Plavix/aspirin on   ____ Stop Anti-inflammatories on    ____ Stop supplements until after surgery.    ____ Bring C-Pap to the hospital.

## 2019-08-12 ENCOUNTER — Other Ambulatory Visit
Admission: RE | Admit: 2019-08-12 | Discharge: 2019-08-12 | Disposition: A | Discharge status: Home / Self Care | Payer: Medicaid Other | Source: Ambulatory Visit | Attending: Obstetrics and Gynecology | Admitting: Obstetrics and Gynecology

## 2019-08-12 ENCOUNTER — Other Ambulatory Visit: Payer: Self-pay

## 2019-08-12 LAB — SARS CORONAVIRUS 2 (TAT 6-24 HRS): SARS Coronavirus 2: NEGATIVE

## 2019-08-16 ENCOUNTER — Inpatient Hospital Stay: Payer: Medicaid Other | Admitting: Certified Registered Nurse Anesthetist

## 2019-08-16 ENCOUNTER — Inpatient Hospital Stay
Admission: RE | Admit: 2019-08-16 | Discharge: 2019-08-18 | DRG: 784 | Disposition: A | Discharge status: Home / Self Care | Payer: Medicaid Other | Attending: Obstetrics and Gynecology | Admitting: Obstetrics and Gynecology

## 2019-08-16 ENCOUNTER — Other Ambulatory Visit: Payer: Self-pay

## 2019-08-16 ENCOUNTER — Encounter
Admission: RE | Disposition: A | Discharge status: Home / Self Care | Payer: Self-pay | Source: Home / Self Care | Attending: Obstetrics and Gynecology

## 2019-08-16 DIAGNOSIS — O99324 Drug use complicating childbirth: Secondary | ICD-10-CM | POA: Diagnosis present

## 2019-08-16 DIAGNOSIS — Z3A39 39 weeks gestation of pregnancy: Secondary | ICD-10-CM

## 2019-08-16 DIAGNOSIS — Z20828 Contact with and (suspected) exposure to other viral communicable diseases: Secondary | ICD-10-CM | POA: Diagnosis present

## 2019-08-16 DIAGNOSIS — F17210 Nicotine dependence, cigarettes, uncomplicated: Secondary | ICD-10-CM | POA: Diagnosis present

## 2019-08-16 DIAGNOSIS — O9081 Anemia of the puerperium: Secondary | ICD-10-CM | POA: Diagnosis not present

## 2019-08-16 DIAGNOSIS — F1290 Cannabis use, unspecified, uncomplicated: Secondary | ICD-10-CM | POA: Diagnosis present

## 2019-08-16 DIAGNOSIS — J4520 Mild intermittent asthma, uncomplicated: Secondary | ICD-10-CM | POA: Diagnosis present

## 2019-08-16 DIAGNOSIS — O34211 Maternal care for low transverse scar from previous cesarean delivery: Secondary | ICD-10-CM | POA: Diagnosis present

## 2019-08-16 DIAGNOSIS — Z302 Encounter for sterilization: Secondary | ICD-10-CM

## 2019-08-16 DIAGNOSIS — O9952 Diseases of the respiratory system complicating childbirth: Secondary | ICD-10-CM | POA: Diagnosis present

## 2019-08-16 DIAGNOSIS — Z9889 Other specified postprocedural states: Secondary | ICD-10-CM

## 2019-08-16 DIAGNOSIS — O99824 Streptococcus B carrier state complicating childbirth: Secondary | ICD-10-CM | POA: Diagnosis present

## 2019-08-16 DIAGNOSIS — O99334 Smoking (tobacco) complicating childbirth: Secondary | ICD-10-CM | POA: Diagnosis present

## 2019-08-16 DIAGNOSIS — D62 Acute posthemorrhagic anemia: Secondary | ICD-10-CM | POA: Diagnosis not present

## 2019-08-16 LAB — URINE DRUG SCREEN, QUALITATIVE (ARMC ONLY)
Amphetamines, Ur Screen: NOT DETECTED
Barbiturates, Ur Screen: NOT DETECTED
Benzodiazepine, Ur Scrn: NOT DETECTED
Cannabinoid 50 Ng, Ur ~~LOC~~: POSITIVE — AB
Cocaine Metabolite,Ur ~~LOC~~: NOT DETECTED
MDMA (Ecstasy)Ur Screen: NOT DETECTED
Methadone Scn, Ur: NOT DETECTED
Opiate, Ur Screen: NOT DETECTED
Phencyclidine (PCP) Ur S: NOT DETECTED
Tricyclic, Ur Screen: NOT DETECTED

## 2019-08-16 LAB — CBC
HCT: 34.5 % — ABNORMAL LOW (ref 36.0–46.0)
Hemoglobin: 11.6 g/dL — ABNORMAL LOW (ref 12.0–15.0)
MCH: 28.9 pg (ref 26.0–34.0)
MCHC: 33.6 g/dL (ref 30.0–36.0)
MCV: 86 fL (ref 80.0–100.0)
Platelets: 148 10*3/uL — ABNORMAL LOW (ref 150–400)
RBC: 4.01 MIL/uL (ref 3.87–5.11)
RDW: 13.9 % (ref 11.5–15.5)
WBC: 9.2 10*3/uL (ref 4.0–10.5)
nRBC: 0 % (ref 0.0–0.2)

## 2019-08-16 LAB — TYPE AND SCREEN
ABO/RH(D): O POS
Antibody Screen: NEGATIVE

## 2019-08-16 SURGERY — Surgical Case
Anesthesia: Spinal | Laterality: Bilateral

## 2019-08-16 MED ORDER — EPHEDRINE SULFATE-NACL 50-0.9 MG/10ML-% IV SOSY
PREFILLED_SYRINGE | INTRAVENOUS | Status: DC | PRN
  Administered 2019-08-16: 5 mg via INTRAVENOUS

## 2019-08-16 MED ORDER — NALBUPHINE HCL 10 MG/ML IJ SOLN: 5.0000 mg | INTRAMUSCULAR | Status: DC | PRN

## 2019-08-16 MED ORDER — BUPIVACAINE LIPOSOME 1.3 % IJ SUSP
INTRAMUSCULAR | Status: DC | PRN
  Administered 2019-08-16: 50 mL

## 2019-08-16 MED ORDER — ACETAMINOPHEN 500 MG PO TABS: 1000.0000 mg | ORAL_TABLET | Freq: Four times a day (QID) | ORAL | Status: DC

## 2019-08-16 MED ORDER — SIMETHICONE 80 MG PO CHEW
80.0000 mg | CHEWABLE_TABLET | Freq: Three times a day (TID) | ORAL | Status: DC
  Administered 2019-08-16 – 2019-08-18 (×7): 80 mg via ORAL
  Filled 2019-08-16 (×7): qty 1

## 2019-08-16 MED ORDER — KETOROLAC TROMETHAMINE 30 MG/ML IJ SOLN
30.0000 mg | Freq: Four times a day (QID) | INTRAMUSCULAR | Status: AC
  Administered 2019-08-16 – 2019-08-17 (×4): 30 mg via INTRAVENOUS
  Filled 2019-08-16 (×3): qty 1

## 2019-08-16 MED ORDER — ACETAMINOPHEN 325 MG PO TABS
650.0000 mg | ORAL_TABLET | Freq: Four times a day (QID) | ORAL | Status: AC
  Administered 2019-08-16 – 2019-08-17 (×4): 650 mg via ORAL
  Filled 2019-08-16 (×4): qty 2

## 2019-08-16 MED ORDER — ONDANSETRON HCL 4 MG/2ML IJ SOLN: 4.0000 mg | Freq: Three times a day (TID) | INTRAMUSCULAR | Status: DC | PRN

## 2019-08-16 MED ORDER — DIPHENHYDRAMINE HCL 50 MG/ML IJ SOLN
12.5000 mg | INTRAMUSCULAR | Status: DC | PRN
  Administered 2019-08-16: 12.5 mg via INTRAVENOUS
  Filled 2019-08-16: qty 1

## 2019-08-16 MED ORDER — OXYCODONE HCL 5 MG PO TABS
5.0000 mg | ORAL_TABLET | ORAL | Status: DC | PRN
  Administered 2019-08-17: 5 mg via ORAL
  Filled 2019-08-16 (×2): qty 1

## 2019-08-16 MED ORDER — MORPHINE SULFATE (PF) 0.5 MG/ML IJ SOLN
INTRAMUSCULAR | Status: DC | PRN
  Administered 2019-08-16: .1 mg via INTRATHECAL

## 2019-08-16 MED ORDER — SODIUM CHLORIDE 0.9% FLUSH: 3.0000 mL | INTRAVENOUS | Status: DC | PRN

## 2019-08-16 MED ORDER — ACETAMINOPHEN 500 MG PO TABS
1000.0000 mg | ORAL_TABLET | Freq: Once | ORAL | Status: AC
  Administered 2019-08-16: 1000 mg via ORAL
  Filled 2019-08-16: qty 2

## 2019-08-16 MED ORDER — METHYLERGONOVINE MALEATE 0.2 MG/ML IJ SOLN
INTRAMUSCULAR | Status: AC
  Filled 2019-08-16: qty 1

## 2019-08-16 MED ORDER — ONDANSETRON HCL 4 MG/2ML IJ SOLN
INTRAMUSCULAR | Status: AC
  Filled 2019-08-16: qty 2

## 2019-08-16 MED ORDER — FENTANYL CITRATE (PF) 100 MCG/2ML IJ SOLN
INTRAMUSCULAR | Status: AC
  Filled 2019-08-16: qty 2

## 2019-08-16 MED ORDER — SIMETHICONE 80 MG PO CHEW
80.0000 mg | CHEWABLE_TABLET | ORAL | Status: DC
  Administered 2019-08-16 – 2019-08-17 (×2): 80 mg via ORAL
  Filled 2019-08-16 (×2): qty 1

## 2019-08-16 MED ORDER — LACTATED RINGERS IV SOLN
INTRAVENOUS | Status: DC
  Administered 2019-08-16: 06:00:00 via INTRAVENOUS

## 2019-08-16 MED ORDER — COCONUT OIL OIL: 1.0000 "application " | TOPICAL_OIL | Status: DC | PRN

## 2019-08-16 MED ORDER — ACETAMINOPHEN 500 MG PO TABS
1000.0000 mg | ORAL_TABLET | Freq: Four times a day (QID) | ORAL | Status: DC
  Administered 2019-08-17 – 2019-08-18 (×4): 1000 mg via ORAL
  Filled 2019-08-16 (×5): qty 2

## 2019-08-16 MED ORDER — CEFAZOLIN SODIUM-DEXTROSE 2-4 GM/100ML-% IV SOLN
2.0000 g | INTRAVENOUS | Status: AC
  Administered 2019-08-16: 2 g via INTRAVENOUS
  Filled 2019-08-16 (×2): qty 100

## 2019-08-16 MED ORDER — DIPHENHYDRAMINE HCL 25 MG PO CAPS: 25.0000 mg | ORAL_CAPSULE | Freq: Four times a day (QID) | ORAL | Status: DC | PRN

## 2019-08-16 MED ORDER — SENNOSIDES-DOCUSATE SODIUM 8.6-50 MG PO TABS
2.0000 | ORAL_TABLET | ORAL | Status: DC
  Administered 2019-08-16 – 2019-08-17 (×2): 2 via ORAL
  Filled 2019-08-16 (×2): qty 2

## 2019-08-16 MED ORDER — OXYTOCIN 40 UNITS IN NORMAL SALINE INFUSION - SIMPLE MED
2.5000 [IU]/h | INTRAVENOUS | Status: AC
  Administered 2019-08-16: 2.5 [IU]/h via INTRAVENOUS
  Filled 2019-08-16: qty 1000

## 2019-08-16 MED ORDER — FENTANYL CITRATE (PF) 100 MCG/2ML IJ SOLN
INTRAMUSCULAR | Status: DC | PRN
  Administered 2019-08-16: 15 ug via INTRATHECAL

## 2019-08-16 MED ORDER — BUPIVACAINE HCL (PF) 0.5 % IJ SOLN
INTRAMUSCULAR | Status: AC
  Filled 2019-08-16: qty 30

## 2019-08-16 MED ORDER — TETANUS-DIPHTH-ACELL PERTUSSIS 5-2.5-18.5 LF-MCG/0.5 IM SUSP: 0.5000 mL | Freq: Once | INTRAMUSCULAR | Status: DC

## 2019-08-16 MED ORDER — OXYCODONE HCL 5 MG PO TABS
ORAL_TABLET | ORAL | Status: AC
  Administered 2019-08-16: 10 mg via ORAL
  Filled 2019-08-16: qty 1

## 2019-08-16 MED ORDER — NALBUPHINE HCL 10 MG/ML IJ SOLN
5.0000 mg | Freq: Once | INTRAMUSCULAR | Status: AC | PRN
  Administered 2019-08-16: 5 mg via INTRAVENOUS
  Filled 2019-08-16: qty 1

## 2019-08-16 MED ORDER — NALOXONE HCL 0.4 MG/ML IJ SOLN: 0.4000 mg | INTRAMUSCULAR | Status: DC | PRN

## 2019-08-16 MED ORDER — MORPHINE SULFATE (PF) 0.5 MG/ML IJ SOLN
INTRAMUSCULAR | Status: AC
  Filled 2019-08-16: qty 10

## 2019-08-16 MED ORDER — LACTATED RINGERS IV SOLN: INTRAVENOUS | Status: DC

## 2019-08-16 MED ORDER — ONDANSETRON HCL 4 MG/2ML IJ SOLN
INTRAMUSCULAR | Status: DC | PRN
  Administered 2019-08-16: 4 mg via INTRAVENOUS

## 2019-08-16 MED ORDER — SODIUM CHLORIDE (PF) 0.9 % IJ SOLN
INTRAMUSCULAR | Status: DC | PRN
  Administered 2019-08-16: 50 mL

## 2019-08-16 MED ORDER — GLYCOPYRROLATE 0.2 MG/ML IJ SOLN
INTRAMUSCULAR | Status: DC | PRN
  Administered 2019-08-16: 0.2 mg via INTRAVENOUS

## 2019-08-16 MED ORDER — BUPIVACAINE LIPOSOME 1.3 % IJ SUSP
INTRAMUSCULAR | Status: AC
  Filled 2019-08-16: qty 20

## 2019-08-16 MED ORDER — SODIUM CHLORIDE 0.9 % IV SOLN
INTRAVENOUS | Status: DC | PRN
  Administered 2019-08-16: 50 ug/min via INTRAVENOUS

## 2019-08-16 MED ORDER — IBUPROFEN 600 MG PO TABS: 600.0000 mg | ORAL_TABLET | Freq: Four times a day (QID) | ORAL | Status: DC | PRN

## 2019-08-16 MED ORDER — GABAPENTIN 300 MG PO CAPS
300.0000 mg | ORAL_CAPSULE | Freq: Every day | ORAL | Status: DC
  Administered 2019-08-16 – 2019-08-17 (×2): 300 mg via ORAL
  Filled 2019-08-16 (×2): qty 1

## 2019-08-16 MED ORDER — OXYCODONE HCL 5 MG PO TABS: 5.0000 mg | ORAL_TABLET | ORAL | Status: DC | PRN

## 2019-08-16 MED ORDER — OXYCODONE HCL 5 MG PO TABS
10.0000 mg | ORAL_TABLET | ORAL | Status: DC | PRN
  Administered 2019-08-16 – 2019-08-18 (×6): 10 mg via ORAL
  Filled 2019-08-16 (×5): qty 2

## 2019-08-16 MED ORDER — KETOROLAC TROMETHAMINE 30 MG/ML IJ SOLN
30.0000 mg | Freq: Once | INTRAMUSCULAR | Status: AC
  Administered 2019-08-16: 30 mg via INTRAVENOUS
  Filled 2019-08-16: qty 1

## 2019-08-16 MED ORDER — PRENATAL MULTIVITAMIN CH
1.0000 | ORAL_TABLET | Freq: Every day | ORAL | Status: DC
  Administered 2019-08-16 – 2019-08-18 (×3): 1 via ORAL
  Filled 2019-08-16 (×3): qty 1

## 2019-08-16 MED ORDER — OXYTOCIN 40 UNITS IN NORMAL SALINE INFUSION - SIMPLE MED
INTRAVENOUS | Status: AC
  Filled 2019-08-16: qty 1000

## 2019-08-16 MED ORDER — ZOLPIDEM TARTRATE 5 MG PO TABS: 5.0000 mg | ORAL_TABLET | Freq: Every evening | ORAL | Status: DC | PRN

## 2019-08-16 MED ORDER — DIBUCAINE (PERIANAL) 1 % EX OINT: 1.0000 "application " | TOPICAL_OINTMENT | CUTANEOUS | Status: DC | PRN

## 2019-08-16 MED ORDER — KETOROLAC TROMETHAMINE 30 MG/ML IJ SOLN
30.0000 mg | Freq: Four times a day (QID) | INTRAMUSCULAR | Status: AC
  Filled 2019-08-16: qty 1

## 2019-08-16 MED ORDER — MENTHOL 3 MG MT LOZG
1.0000 | LOZENGE | OROMUCOSAL | Status: DC | PRN
  Filled 2019-08-16: qty 9

## 2019-08-16 MED ORDER — EPHEDRINE SULFATE 50 MG/ML IJ SOLN
INTRAMUSCULAR | Status: AC
  Filled 2019-08-16: qty 1

## 2019-08-16 MED ORDER — BUPIVACAINE IN DEXTROSE 0.75-8.25 % IT SOLN
INTRATHECAL | Status: DC | PRN
  Administered 2019-08-16: 1.6 mL via INTRATHECAL

## 2019-08-16 MED ORDER — WITCH HAZEL-GLYCERIN EX PADS: 1.0000 "application " | MEDICATED_PAD | CUTANEOUS | Status: DC | PRN

## 2019-08-16 MED ORDER — MORPHINE SULFATE (PF) 2 MG/ML IV SOLN
1.0000 mg | INTRAVENOUS | Status: DC | PRN
  Administered 2019-08-16: 2 mg via INTRAVENOUS
  Filled 2019-08-16: qty 1

## 2019-08-16 MED ORDER — NALBUPHINE HCL 10 MG/ML IJ SOLN: 5.0000 mg | Freq: Once | INTRAMUSCULAR | Status: AC | PRN

## 2019-08-16 MED ORDER — IBUPROFEN 600 MG PO TABS
600.0000 mg | ORAL_TABLET | Freq: Four times a day (QID) | ORAL | Status: DC | PRN
  Administered 2019-08-17 – 2019-08-18 (×3): 600 mg via ORAL
  Filled 2019-08-16 (×3): qty 1

## 2019-08-16 MED ORDER — ACETAMINOPHEN 325 MG PO TABS: 650.0000 mg | ORAL_TABLET | Freq: Four times a day (QID) | ORAL | Status: DC

## 2019-08-16 MED ORDER — SIMETHICONE 80 MG PO CHEW
80.0000 mg | CHEWABLE_TABLET | ORAL | Status: DC | PRN
  Filled 2019-08-16: qty 1

## 2019-08-16 MED ORDER — DIPHENHYDRAMINE HCL 25 MG PO CAPS: 25.0000 mg | ORAL_CAPSULE | ORAL | Status: DC | PRN

## 2019-08-16 MED ORDER — NALOXONE HCL 4 MG/10ML IJ SOLN
1.0000 ug/kg/h | INTRAVENOUS | Status: DC | PRN
  Filled 2019-08-16: qty 5

## 2019-08-16 MED ORDER — PHENYLEPHRINE HCL (PRESSORS) 10 MG/ML IV SOLN
INTRAVENOUS | Status: AC
  Filled 2019-08-16: qty 1

## 2019-08-16 MED ORDER — ENOXAPARIN SODIUM 40 MG/0.4ML ~~LOC~~ SOLN
40.0000 mg | SUBCUTANEOUS | Status: DC
  Filled 2019-08-16: qty 0.4

## 2019-08-16 MED ORDER — SODIUM CHLORIDE (PF) 0.9 % IJ SOLN
INTRAMUSCULAR | Status: AC
  Filled 2019-08-16: qty 50

## 2019-08-16 MED ORDER — OXYTOCIN 40 UNITS IN NORMAL SALINE INFUSION - SIMPLE MED
INTRAVENOUS | Status: DC | PRN
  Administered 2019-08-16: 500 mL via INTRAVENOUS

## 2019-08-16 MED ORDER — SOD CITRATE-CITRIC ACID 500-334 MG/5ML PO SOLN
ORAL | Status: AC
  Administered 2019-08-16: 30 mL
  Filled 2019-08-16: qty 30

## 2019-08-16 SURGICAL SUPPLY — 28 items
BARRIER ADHS 3X4 INTERCEED (GAUZE/BANDAGES/DRESSINGS) ×3 IMPLANT
CANISTER SUCT 3000ML PPV (MISCELLANEOUS) ×3 IMPLANT
CHLORAPREP W/TINT 26 (MISCELLANEOUS) ×3 IMPLANT
COVER WAND RF STERILE (DRAPES) ×3 IMPLANT
DRSG TELFA 3X8 NADH (GAUZE/BANDAGES/DRESSINGS) ×3 IMPLANT
ELECT CAUTERY BLADE 6.4 (BLADE) ×3 IMPLANT
ELECT REM PT RETURN 9FT ADLT (ELECTROSURGICAL) ×3
ELECTRODE REM PT RTRN 9FT ADLT (ELECTROSURGICAL) ×1 IMPLANT
GAUZE SPONGE 4X4 12PLY STRL (GAUZE/BANDAGES/DRESSINGS) ×3 IMPLANT
GLOVE BIO SURGEON STRL SZ8 (GLOVE) ×3 IMPLANT
GOWN STRL REUS W/ TWL LRG LVL3 (GOWN DISPOSABLE) ×2 IMPLANT
GOWN STRL REUS W/ TWL XL LVL3 (GOWN DISPOSABLE) ×1 IMPLANT
GOWN STRL REUS W/TWL LRG LVL3 (GOWN DISPOSABLE) ×4
GOWN STRL REUS W/TWL XL LVL3 (GOWN DISPOSABLE) ×2
NS IRRIG 1000ML POUR BTL (IV SOLUTION) ×3 IMPLANT
PACK C SECTION AR (MISCELLANEOUS) ×3 IMPLANT
PAD DRESSING TELFA 3X8 NADH (GAUZE/BANDAGES/DRESSINGS) ×1 IMPLANT
PAD OB MATERNITY 4.3X12.25 (PERSONAL CARE ITEMS) ×3 IMPLANT
PAD PREP 24X41 OB/GYN DISP (PERSONAL CARE ITEMS) ×3 IMPLANT
PENCIL SMOKE ULTRAEVAC 22 CON (MISCELLANEOUS) ×3 IMPLANT
RETRACTOR TRAXI PANNICULUS (MISCELLANEOUS) IMPLANT
STAPLER INSORB 30 2030 C-SECTI (MISCELLANEOUS) ×2 IMPLANT
STRAP SAFETY 5IN WIDE (MISCELLANEOUS) ×3 IMPLANT
SUCT VACUUM KIWI BELL (SUCTIONS) ×2 IMPLANT
SUT CHROMIC 1 CTX 36 (SUTURE) ×9 IMPLANT
SUT PLAIN GUT 0 (SUTURE) ×6 IMPLANT
SUT VIC AB 0 CT1 36 (SUTURE) ×6 IMPLANT
TRAXI PANNICULUS RETRACTOR (MISCELLANEOUS)

## 2019-08-16 NOTE — Op Note (Signed)
NAME: [name], Erica D. MEDICAL RECORD [id number] ACCOUNT [id number] DATE OF BIRTH:Nov 06, 1986 FACILITY: [facility] LOCATION: [facility] PHYSICIAN:THOMAS Cloyde ReamsJ. SCHERMERHORN, MD  OPERATIVE REPORT  DATE OF PROCEDURE:  08/16/2019  PREOPERATIVE DIAGNOSES: 1.  Elective repeat cesarean section. 2.  Elective sterilization.   3.  At 39+3 weeks estimated gestational age.  POSTOPERATIVE DIAGNOSES: 1.  Elective repeat cesarean section. 2.  Elective sterilization. 3.  At 39+3 weeks estimated gestational age.  PROCEDURE:   1.  Repeat low transverse cesarean section. 2.  Bilateral tubal ligation, [place].  ANESTHESIA:  Spinal.  SURGEON:  Jennell Cornerhomas Schermerhorn, MD.  FIRST ASSISTANT:  Heloise Ochoaebecca McVey, certified nurse midwife.  INDICATIONS:  A 33 year old gravida 5, para 4 patient with 2 prior cesarean sections and has elected for a repeat cesarean section and elective sterilization.  The patient reconfirms a desire for elective sterilization the day of the study.  DESCRIPTION OF PROCEDURE:  After adequate spinal anesthesia, the patient was placed in dorsal supine position, hip roll on the right side.  The patient's abdomen was prepped and draped in normal sterile fashion.  Timeout was performed.  The patient did  receive 2 grams IV Ancef prior to commencement of the case.  A Pfannenstiel incision was made 2 fingerbreadths above the symphysis pubis.  Sharp dissection was used to identify the fascia.  Fascia was opened in the midline and opened in a transverse  fashion.  The superior aspect of the fascia was grasped with Kocher clamps and the recti muscles were dissected free.  Inferior aspect of the fascia was grasped with Kocher clamps and pyramidalis muscle was dissected free.  Entry into the peritoneal  cavity was accomplished sharply.  The vesicouterine peritoneal fold was identified and was attenuated.  Therefore, a direct low transverse uterine incision was made.  Upon entry into the endometrial  cavity, slight meconium stained fluid was noted.  The  incision was extended with blunt transverse traction.  Fetal head was brought to the incision and a Kiwi bell vacuum was placed on the occiput of the fetal head to aid in delivery.  With one gentle pull, the head was delivered and the vacuum was removed.   A nuchal cord was noted and was reduced and delivery of the shoulders and body were accomplished without difficulty.  A vigorous female was dried on the abdomen for 60 seconds, cord was clamped and vigorous infant female was passed to nursery staff who  assigned Apgars of 9 and 9.  Time of birth was 8:10 on 08/16/2019.  The placenta was manually delivered and the uterus was exteriorized.  Endometrial cavity was wiped clean with laparotomy tape.  Uterine incision was closed with #1 chromic suture in a  running locking fashion.  Good approximation of edges.  Good hemostasis was noted.  Attention was directed to the patient's right fallopian tube, which was grasped at the midportion of the fallopian tube with a Babcock clamp.  Two separate 0 plain gut  sutures were placed at the midportion of the fallopian tube and a 1.5 cm portion of fallopian tube was removed.  Similar procedure was repeated on the left fallopian tube again after placing 2 separate 0 plain gut sutures a 1.5 cm portion of fallopian  tube was removed.  The uterus was placed back into the abdominal cavity and the abdominal cavity was irrigated and suctioned, and the pericolic gutters were wiped clean with laparotomy tape.  Both tubal ligation sites appeared hemostatic.  Uterine  incision appeared hemostatic.  Interceed was placed  over the uterine incision in a T-shaped fashion and the fascia was closed with 0 Vicryl suture in a running nonlocking fashion.  The fascial edges were then injected with a solution of 20 mL of 1.3%  Exparel with 30 mL of 0.5% Marcaine plus 50 mL normal saline; approximately 50 mL solution was injected into the  fascial planes.  Subcutaneous tissues were irrigated and bovied for hemostasis and the skin was reapproximated with Insorb absorbable staples  and additional 40 mL of Exparel solution injected at the skin level.  COMPLICATIONS:  There were no complications.  ESTIMATED BLOOD LOSS:  200 mL  INTRAOPERATIVE FLUIDS:  950 mL  URINE OUTPUT:  50 mL  DISPOSITION:  The patient was taken to recovery room in good condition.  TN/NUANCE  D:08/16/2019 T:08/16/2019 JOB:[id number]

## 2019-08-16 NOTE — Anesthesia Procedure Notes (Signed)
Spinal  Patient location during procedure: OR Start time: 08/16/2019 7:47 AM End time: 08/16/2019 7:53 AM Staffing Anesthesiologist: Emmie Niemann, MD Resident/CRNA: Eben Burow, CRNA Performed: resident/CRNA  Preanesthetic Checklist Completed: patient identified, site marked, surgical consent, pre-op evaluation, timeout performed, IV checked, risks and benefits discussed and monitors and equipment checked Spinal Block Patient position: sitting Prep: ChloraPrep Patient monitoring: heart rate, cardiac monitor, continuous pulse ox and blood pressure Approach: midline Location: L3-4 Injection technique: single-shot Needle Needle type: Introducer and Pencil-Tip  Needle gauge: 24 G Needle length: 9 cm Assessment Sensory level: T4

## 2019-08-16 NOTE — Brief Op Note (Signed)
08/16/2019  8:58 AM  PATIENT:  [name]  33 y.o. female  PRE-OPERATIVE DIAGNOSIS:  elective repeat c-section, sterilization  POST-OPERATIVE DIAGNOSIS:  elective repeat c-section, sterilization  PROCEDURE:  Procedure(s): REPEAT CESAREAN SECTION WITH BILATERAL TUBAL LIGATION, (Bilateral)  SURGEON:  Surgeon(s) and Role:    * Schermerhorn, Gwen Her, MD - Primary  PHYSICIAN ASSISTANT: R Mcvey , CNM  ASSISTANTS: none   ANESTHESIA:   spinal  EBL:  200 mL   BLOOD ADMINISTERED:none  DRAINS: Urinary Catheter (Foley)   LOCAL MEDICATIONS USED:  MARCAINE    and BUPIVICAINE   SPECIMEN:  Source of Specimen:  portion right and left tube   DISPOSITION OF SPECIMEN:  PATHOLOGY  COUNTS:  YES  TOURNIQUET:  * No tourniquets in log *  DICTATION: .Other Dictation: Dictation Number verbal  PLAN OF CARE: Admit to inpatient   PATIENT DISPOSITION:  PACU - hemodynamically stable.   Delay start of Pharmacological VTE agent (>24hrs) due to surgical blood loss or risk of bleeding: not applicable

## 2019-08-16 NOTE — Transfer of Care (Signed)
Immediate Anesthesia Transfer of Care Note  Patient: [name]  Procedure(s) Performed: REPEAT CESAREAN SECTION WITH BILATERAL TUBAL LIGATION, (Bilateral )  Patient Location: PACU and Mother/Baby  Anesthesia Type:Spinal  Level of Consciousness: awake, alert , oriented and patient cooperative  Airway & Oxygen Therapy: Patient Spontanous Breathing  Post-op Assessment: Report given to RN and Post -op Vital signs reviewed and stable  Post vital signs: Reviewed and stable  Last Vitals:  Vitals Value Taken Time  BP 113/70   Temp    Pulse 70   Resp 12   SpO2 98     Last Pain:  Vitals:   08/16/19 0600  TempSrc:   PainSc: 8          Complications: No apparent anesthesia complications

## 2019-08-16 NOTE — Anesthesia Post-op Follow-up Note (Signed)
Anesthesia QCDR form completed.        

## 2019-08-16 NOTE — Anesthesia Preprocedure Evaluation (Signed)
Anesthesia Evaluation  Patient identified by MRN, date of birth, ID band Patient awake    Reviewed: Allergy & Precautions, NPO status , Patient's Chart, lab work & pertinent test results  History of Anesthesia Complications Negative for: history of anesthetic complications  Airway Mallampati: II  TM Distance: >3 FB Neck ROM: Full    Dental no notable dental hx.    Pulmonary asthma , neg sleep apnea, former smoker,    breath sounds clear to auscultation- rhonchi (-) wheezing      Cardiovascular Exercise Tolerance: Good (-) hypertension(-) CAD, (-) Past MI, (-) Cardiac Stents and (-) CABG  Rhythm:Regular Rate:Normal - Systolic murmurs and - Diastolic murmurs    Neuro/Psych neg Seizures PSYCHIATRIC DISORDERS Anxiety Depression negative neurological ROS     GI/Hepatic negative GI ROS, Neg liver ROS,   Endo/Other  negative endocrine ROSneg diabetes  Renal/GU negative Renal ROS     Musculoskeletal negative musculoskeletal ROS (+)   Abdominal (+) - obese, Gravid abdomen  Peds  Hematology  (+) anemia ,   Anesthesia Other Findings Past Medical History: No date: Anemia No date: Anxiety No date: Asthma No date: Cervical cyst No date: Depression   Reproductive/Obstetrics (+) Pregnancy                             Lab Results  Component Value Date   WBC 9.2 08/16/2019   HGB 11.6 (L) 08/16/2019   HCT 34.5 (L) 08/16/2019   MCV 86.0 08/16/2019   PLT 148 (L) 08/16/2019    Anesthesia Physical Anesthesia Plan  ASA: II  Anesthesia Plan: Spinal   Post-op Pain Management:    Induction:   PONV Risk Score and Plan: 2 and Ondansetron  Airway Management Planned: Natural Airway  Additional Equipment:   Intra-op Plan:   Post-operative Plan:   Informed Consent: I have reviewed the patients History and Physical, chart, labs and discussed the procedure including the risks, benefits and  alternatives for the proposed anesthesia with the patient or authorized representative who has indicated his/her understanding and acceptance.     Dental advisory given  Plan Discussed with: CRNA and Anesthesiologist  Anesthesia Plan Comments:         Anesthesia Quick Evaluation
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
## 2019-08-16 NOTE — Discharge Summary (Signed)
Obstetrical Discharge Summary  Patient Name: [name] DOB: 1986-08-16 MRN: [id number]  Date of Admission: 08/16/2019 Date of Delivery: 08/16/2019 Delivered by: Erica Cote MD Date of Discharge: 08/18/2019  Primary OB: [facility]  LMP:No LMP recorded. EDC Estimated Date of Delivery: 08/20/19 Gestational Age at Delivery: [redacted]w[redacted]d   Antepartum complications:+ MJ  Admitting Diagnosis: elective repeat LTCS and BTL  Secondary Diagnosis: Patient Active Problem List   Diagnosis Date Noted  . Delivery by elective cesarean section 08/16/2019  . Previous cesarean delivery affecting pregnancy 07/28/2018  . Postoperative state 07/28/2018  . Uterine contractions during pregnancy 07/14/2018  . Mild intermittent asthma 03/19/2017  . Pregnancy, supervision, high-risk, third trimester 03/17/2017  . Abruptio placenta, third trimester 03/17/2017  . [redacted] weeks gestation of pregnancy 03/17/2017  . Marijuana use 03/17/2017    Augmentation: n/[name] Complications: None Intrapartum complications/course: none Date of Delivery: 08/16/19 Delivered By: Erica Cote MD Delivery Type: repeat cesarean section, low transverse incision Anesthesia: epidural Placenta: manual Laceration: none Episiotomy: none Newborn Data: Live born female  Birth Weight: 6 lb 7 oz (2920 g) APGAR: 9, 9  Newborn Delivery   Birth date/time: 08/16/2019 08:10:00 Delivery type: C-Section, Low Transverse Trial of labor: No C-section categorization: Repeat        Postpartum Procedures: none  Cesarean Section:  Patient had an uncomplicated postpartum course.  By time of discharge on POD#2, her pain was controlled on oral pain medications; she had appropriate lochia and was ambulating, voiding without difficulty, tolerating regular diet and passing flatus.   She was deemed stable for discharge to home.    Discharge Physical Exam:  BP 118/77 (BP Location: Right Arm)   Pulse 64   Temp 98.4 F (36.9 C) (Oral)    Resp 18   Ht 5\' 7"  (1.702 m)   Wt 71.2 kg   SpO2 100%   Breastfeeding Unknown   BMI 24.59 kg/m   General: NAD CV: RRR Pulm: CTABL, nl effort ABD: s/nd/nt, fundus firm and below the umbilicus Lochia: moderate Incision: intact, slightly raised incision, small amt serosanguinous drainage on right side only. steristrip applied at 2cm from right edge.  Uterus: firm, U/3 DVT Evaluation: LE non-ttp, no evidence of DVT on exam.  Hemoglobin  Date Value Ref Range Status  08/17/2019 10.4 (L) 12.0 - 15.0 g/dL Final   HCT  Date Value Ref Range Status  08/17/2019 31.4 (L) 36.0 - 46.0 % Final     Disposition: stable, discharge to home. Baby Feeding: breastmilk Baby Disposition: home with mom  Rh Immune globulin given: n/[name] Rubella vaccine given: immune Tdap vaccine given in AP or PP setting: given Flu vaccine given in AP or PP setting: n/[name]  Contraception: BTL - completed  Prenatal Labs:   ABO, Rh:  O+ Antibody:  neg Rubella:   immune vz Imm RPR:   neg HBsAg:   nr HIV:   neg GBS:  + Covid[x ] negative    Plan:  [name] was discharged to home in good condition. Follow-up appointment with delivering provider in 2 weeks.  Discharge Medications: Allergies as of 08/18/2019      Reactions   Peanut-containing Drug Products Anaphylaxis, Hives   Demerol [meperidine] Hives      Medication List    TAKE these medications   acetaminophen 500 MG tablet Commonly known as: TYLENOL Take 2 tablets (1,000 mg total) by mouth every 6 (six) hours for 7 days.   gabapentin 300 MG capsule Commonly known as: NEURONTIN Take  1 capsule (300 mg total) by mouth at bedtime for 3 days.   ibuprofen 600 MG tablet Commonly known as: ADVIL Take 1 tablet (600 mg total) by mouth every 6 (six) hours as needed for moderate pain.   oxyCODONE 5 MG immediate release tablet Commonly known as: Oxy IR/ROXICODONE Take 1 tablet (5 mg total) by mouth every 4 (four) hours as needed for up to 7 days  for moderate pain.   senna-docusate 8.6-50 MG tablet Commonly known as: Senokot-S Take 2 tablets by mouth daily. Start taking on: August 19, 2019   sertraline 25 MG tablet Commonly known as: Zoloft Take 1 tablet (25 mg total) by mouth daily. X 1week, then increase to 2 tabs daily   simethicone 80 MG chewable tablet Commonly known as: MYLICON Chew 1 tablet (80 mg total) by mouth 3 (three) times daily after meals.       Follow-up Information    [name], Erica Austinhomas J, MD Follow up in 2 week(s).   Specialty: Obstetrics and Gynecology Why: Postop visit- needs depression screen Contact information: [address] [facility] [place] [address] [phone]           Signed: Randa NgoRebecca [name] , CNM 08/18/2019  11:34 AM

## 2019-08-16 NOTE — Plan of Care (Signed)
Stable Postpartum C-section.  Incisional dressing clean/ dry and intact.  Patient denies pain at this time.  Pacu and post-op plan of care discussed.  Newborn care and breastfeeding discussed.  Demonstrated correct latch, patient skin to skin with infant.  Bonding well.

## 2019-08-17 LAB — RPR: RPR Ser Ql: NONREACTIVE

## 2019-08-17 LAB — CBC
HCT: 31.4 % — ABNORMAL LOW (ref 36.0–46.0)
Hemoglobin: 10.4 g/dL — ABNORMAL LOW (ref 12.0–15.0)
MCH: 28.7 pg (ref 26.0–34.0)
MCHC: 33.1 g/dL (ref 30.0–36.0)
MCV: 86.7 fL (ref 80.0–100.0)
Platelets: 134 10*3/uL — ABNORMAL LOW (ref 150–400)
RBC: 3.62 MIL/uL — ABNORMAL LOW (ref 3.87–5.11)
RDW: 13.8 % (ref 11.5–15.5)
WBC: 12.4 10*3/uL — ABNORMAL HIGH (ref 4.0–10.5)
nRBC: 0 % (ref 0.0–0.2)

## 2019-08-17 LAB — SURGICAL PATHOLOGY

## 2019-08-17 MED ORDER — SIMETHICONE 80 MG PO CHEW: 80.0000 mg | CHEWABLE_TABLET | Freq: Four times a day (QID) | ORAL | Status: DC | PRN

## 2019-08-17 NOTE — Anesthesia Post-op Follow-up Note (Signed)
  Anesthesia Pain Follow-up Note  Patient: SHARILYNN CASSITY  Day #: 1  Date of Follow-up: 08/17/2019 Time: 8:13 AM  Last Vitals:  Vitals:   08/17/19 0700 08/17/19 0725  BP:  125/90  Pulse:  (!) 57  Resp:  18  Temp:  36.8 C  SpO2: 99%     Level of Consciousness: alert  Pain: moderate   Side Effects:None  Catheter Site Exam:clean, dry, no drainage  Anti-Coag Meds (From admission, onward)   Start     Dose/Rate Route Frequency Ordered Stop   08/17/19 0800  enoxaparin (LOVENOX) injection 40 mg     40 mg Subcutaneous Every 24 hours 08/16/19 1119         Plan: D/C from anesthesia care at surgeon's request  [name]

## 2019-08-17 NOTE — Anesthesia Postprocedure Evaluation (Signed)
Anesthesia Post Note  Patient: [name]  Procedure(s) Performed: REPEAT CESAREAN SECTION WITH BILATERAL TUBAL LIGATION, (Bilateral )  Patient location during evaluation: Mother Baby Anesthesia Type: Spinal Level of consciousness: awake and alert and oriented Pain management: pain level controlled Vital Signs Assessment: post-procedure vital signs reviewed and stable Respiratory status: spontaneous breathing and respiratory function stable Cardiovascular status: stable Postop Assessment: no headache, no backache, patient able to bend at knees, adequate PO intake, no apparent nausea or vomiting and able to ambulate Anesthetic complications: no     Last Vitals:  Vitals:   08/17/19 0700 08/17/19 0725  BP:  125/90  Pulse:  (!) 57  Resp:  18  Temp:  36.8 C  SpO2: 99%     Last Pain:  Vitals:   08/17/19 0725  TempSrc: Oral  PainSc:                  [name]

## 2019-08-17 NOTE — Progress Notes (Addendum)
Subjective: Postpartum Day 1: Cesarean Delivery Patient reports incisional pain. Pain with cramping and top of legs, in lower back. Nursing is going well. She also has right upper shoulder pain.    Objective: Vital signs in last 24 hours: Temp:  [97.8 F (36.6 C)-98.2 F (36.8 C)] 98.2 F (36.8 C) (08/25 2338) Pulse Rate:  [51-91] 64 (08/25 2338) Resp:  [16-20] 20 (08/25 2338) BP: (117-129)/(69-90) 129/78 (08/25 2338) SpO2:  [99 %-100 %] 100 % (08/25 2338)  Physical Exam:  General: alert, cooperative and appears stated age Lochia: appropriate Uterine Fundus: firm, non tender Incision: dressing c/d/i DVT Evaluation: No evidence of DVT seen on physical exam. Negative Homan's sign. No cords or calf tenderness. No significant calf/ankle edema.  Recent Labs    08/16/19 0554 08/17/19 0501  HGB 11.6* 10.4*  HCT 34.5* 31.4*    Assessment/Plan: Status post Cesarean section. Doing well postoperatively.  Continue current care. Lactational support -Acute blood loss anemia - hemodynamically stable and asymptomatic; start PO ferrous sulfate BID with stool softeners and ascorbic acid. -pain meds prn. On exam no concern for fundal tenderness.  Benjaman Kindler 08/17/2019, 11:39 PM

## 2019-08-17 NOTE — Lactation Note (Signed)
This note was copied from a baby's chart. Lactation Consultation Note  Patient Name: [name] [id number] Date: 08/17/2019    RN spoke with [facility] this morning reported that mom was desiring to breastfeed. LC was available yesterday, and was told that mom declined [place] assistance. LC went in to speak with mom. Mom does desire to breastfeed more, but feels that she doesn't have enough milk as baby falls asleep shortly after latching. Mom states that this isn't the first baby that she has breastfed, but doesn't understand why he falls asleep so quickly. Discussed with mom newborn feeding patterns, and breastfeeding basics. Reviewed newborn stomach sizes, and the need for feeding often in small amounts- growth/spurts and cluster feeding. Provided tips for keeping infant alert at the breast. Encouraged mom to offer the breast first when infant displays the early hunger cues for breast stimulation and to encourage adequate milk supply as baby ages. Reviewed wet/stool diapers for day 2, and possibility of cluster feeding at this 24 hour mark, ensuring not to miss feedings. [place] left her name and number on the whiteboard. Encouraged mom to call out to [facility] for assistance and/or to observe the next feed.   Maternal Data    Feeding    LATCH Score                   Interventions    Lactation Tools Discussed/Used     Consult Status      Lavonia Drafts 08/17/2019, 9:32 AM

## 2019-08-17 NOTE — Anesthesia Postprocedure Evaluation (Signed)
Anesthesia Post Note  Patient: [name]  Procedure(s) Performed: REPEAT CESAREAN SECTION WITH BILATERAL TUBAL LIGATION, (Bilateral )  Anesthesia Type: Spinal     Last Vitals:  Vitals:   08/17/19 0700 08/17/19 0725  BP:  125/90  Pulse:  (!) 57  Resp:  18  Temp:  36.8 C  SpO2: 99%     Last Pain:  Vitals:   08/17/19 0725  TempSrc: Oral  PainSc:                  [name]

## 2019-08-18 MED ORDER — OXYCODONE HCL 5 MG PO TABS: 5.0000 mg | ORAL_TABLET | ORAL | 0 refills | Status: AC | PRN

## 2019-08-18 MED ORDER — IBUPROFEN 600 MG PO TABS: 600.0000 mg | ORAL_TABLET | Freq: Four times a day (QID) | ORAL | 0 refills | Status: DC | PRN

## 2019-08-18 MED ORDER — ACETAMINOPHEN 500 MG PO TABS: 1000.0000 mg | ORAL_TABLET | Freq: Four times a day (QID) | ORAL | 0 refills | Status: AC

## 2019-08-18 MED ORDER — GABAPENTIN 300 MG PO CAPS: 300.0000 mg | ORAL_CAPSULE | Freq: Every day | ORAL | 0 refills | Status: AC

## 2019-08-18 MED ORDER — SIMETHICONE 80 MG PO CHEW: 80.0000 mg | CHEWABLE_TABLET | Freq: Three times a day (TID) | ORAL | 0 refills | Status: AC

## 2019-08-18 MED ORDER — SERTRALINE HCL 25 MG PO TABS: 25.0000 mg | ORAL_TABLET | Freq: Every day | ORAL | 2 refills | Status: AC

## 2019-08-18 MED ORDER — SENNOSIDES-DOCUSATE SODIUM 8.6-50 MG PO TABS: 2.0000 | ORAL_TABLET | ORAL | 0 refills | Status: AC

## 2019-08-18 NOTE — Progress Notes (Signed)
Clinical Social Worker (CSW) made a child protective services (CPS) report today in [place] due to infant's positive urine drug screen for marijuana.  Keval Nam, LCSW [phone]  

## 2019-08-18 NOTE — Discharge Instructions (Signed)
Please call your doctor or return to the ER if you experience any chest pains, shortness of breath, dizziness, visual changes, severe headache (unrelieved by pain meds), fever greater than 101, any heavy bleeding (saturating more than 1 pad per hour), large clots, or foul smelling discharge, any leg pain/redness, any worsening abdominal pain and cramping that is not controlled by pain medication, any breast concerns (pain/redness), or any signs of postpartum depression. No tampons, enemas, douches, or sexual intercourse for 6 weeks. Also avoid tub baths, hot tubs, or swimming for 6 weeks.    Check your incision daily for any signs of infection such as redness, warmth, swelling, increased pain, or pus/foul smelling draiange   Activity: do not lift over 10 lbs for 6 weeks  No driving for 1-2 weeks  Pelvic rest for 6 weeks

## 2019-08-18 NOTE — Progress Notes (Signed)
Discharge order received from doctor. Tdap vaccine offered at discharge pt refused. Reviewed discharge instructions including incision cleaning kit and prescriptions with patient and answered all questions. Follow up appointment given. Mental health resources given at discharge. Patient verbalized understanding. ID bands checked. Patient discharged home with infant via wheelchair by nursing/auxillary.     Hilbert Bible, RN

## 2019-08-18 NOTE — Progress Notes (Signed)
Post Partum Day 2 Subjective: Doing well, no complaints.  Tolerating regular diet, pain with PO meds, voiding and ambulating without difficulty. Having some increased drainage from right side of incision, noted when she was moving around and crying. Having many episodes of tears today, difficult social situation, FOB has not been present for most of her hospital stay. She was not taking her zoloft as rx during pregnancy, would like to restart.   No CP SOB Fever,Chills, N/V or leg pain; denies nipple or breast pain; no HA change of vision, RUQ/epigastric pain  Objective: BP 118/77 (BP Location: Right Arm)   Pulse 64   Temp 98.4 F (36.9 C) (Oral)   Resp 18   Ht 5\' 7"  (1.702 m)   Wt 71.2 kg   SpO2 100%   Breastfeeding Unknown   BMI 24.59 kg/m    Physical Exam:  General: NAD Breasts: soft/nontender, filling CV: RRR Pulm: nl effort, CTABL Abdomen: soft, NT, BS x 4 Incision: intact, slightly raised incision, small amt serosanguinous drainage on right side only. steristrip applied at 2cm from right edge.  Uterus: firm, U/3 Lochia: scant Uterine Fundus: fundus firm and 3 fb below umbilicus DVT Evaluation: no cords, ttp LEs   Recent Labs    08/16/19 0554 08/17/19 0501  HGB 11.6* 10.4*  HCT 34.5* 31.4*  WBC 9.2 12.4*  PLT 148* 134*    Assessment/Plan: 33 y.o. [id number] postpartum day # 2  - Continue routine PP care - Lactation consult prn.  - BTL done with surgery - stable Hgb, no acute anemia - Immunization status: all Imms up to date    Disposition: Does desire Dc home today.     Francetta Found, CNM 08/18/2019  11:22 AM

## 2019-08-20 NOTE — Progress Notes (Signed)
8/28: Clinical Social Worker (CSW) contacted [employer] child protective services (CPS) intake and spoke to [place]. CSW made Chrissie Noa aware that infant's cord tissue had THC present. Per Chrissie Noa he will make CPS worker aware of above.   [employer], LCSW [phone]

## 2020-05-05 IMAGING — US US OB < 14 WEEKS - US OB TV
1 series · 14 of 28 positions shown · non-contrast
Comparison: None

CLINICAL DATA: Early pregnancy, dating and viability

EXAM:
OBSTETRIC <14 WK US AND TRANSVAGINAL OB US
TECHNIQUE: Both transabdominal and transvaginal ultrasound examinations were
performed for complete evaluation of the gestation as well as the
maternal uterus, adnexal regions, and pelvic cul-de-sac.
Transvaginal technique was performed to assess early pregnancy.

[Series 1: us ob < 14 weeks - us ob tv · 0.23mm/px · 14 of 49 slices shown]
[im 2/49]
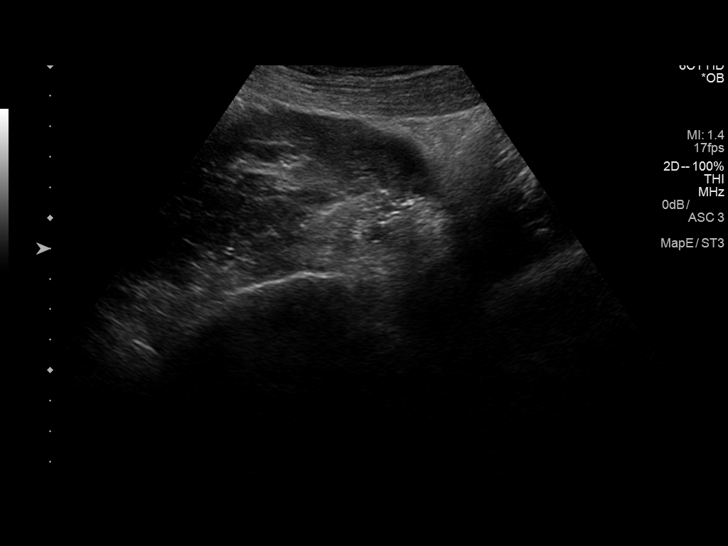
[im 6/49]
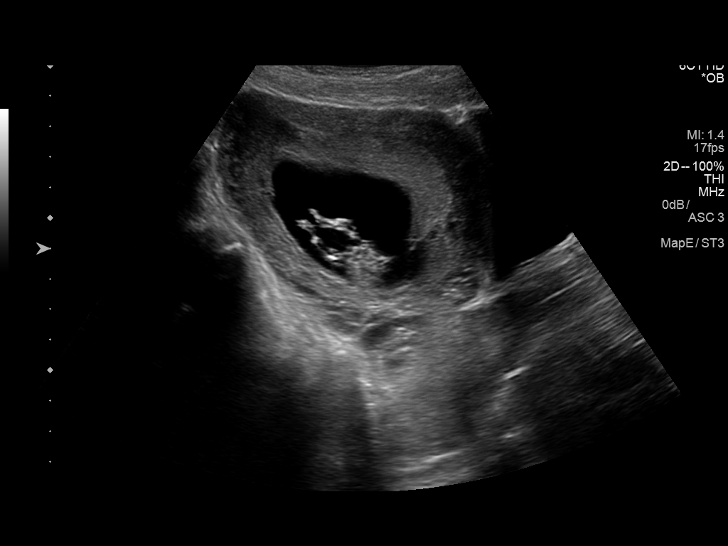
[im 9/49]
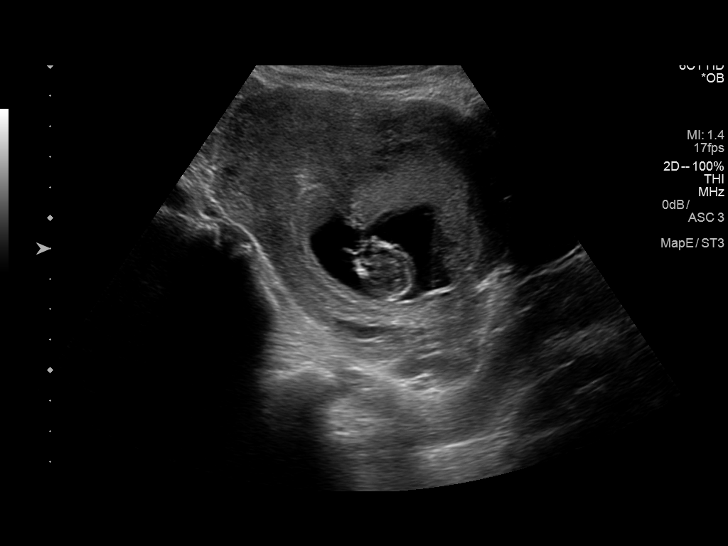
[im 13/49]
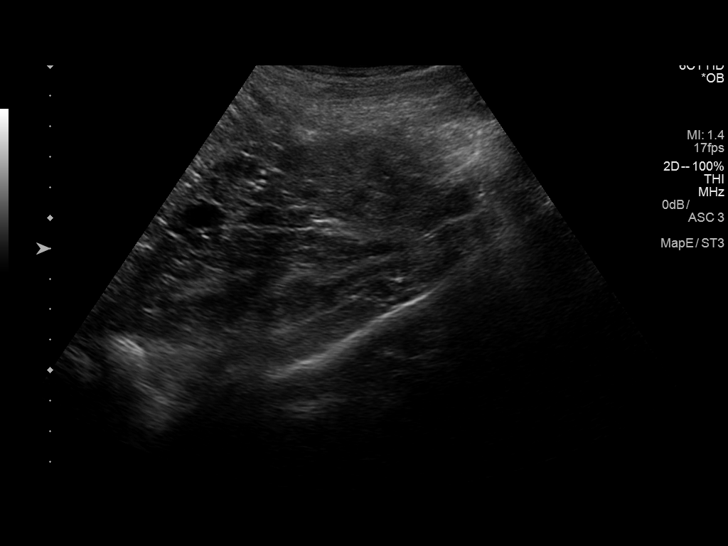
[im 17/49]
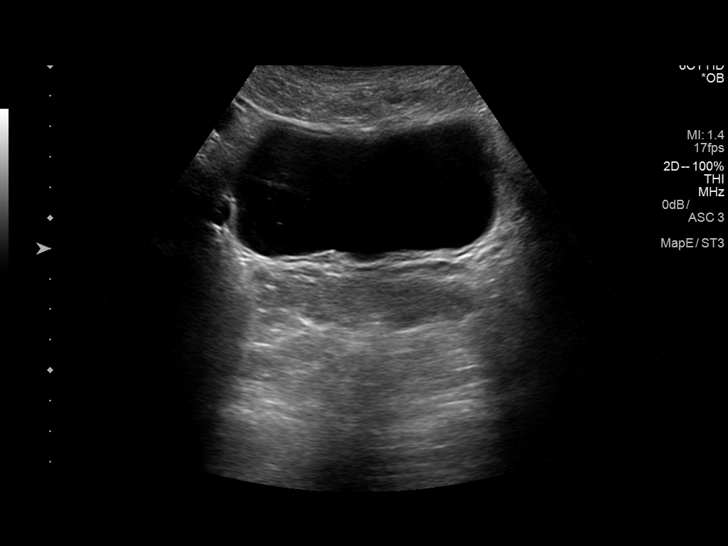
[im 20/49]
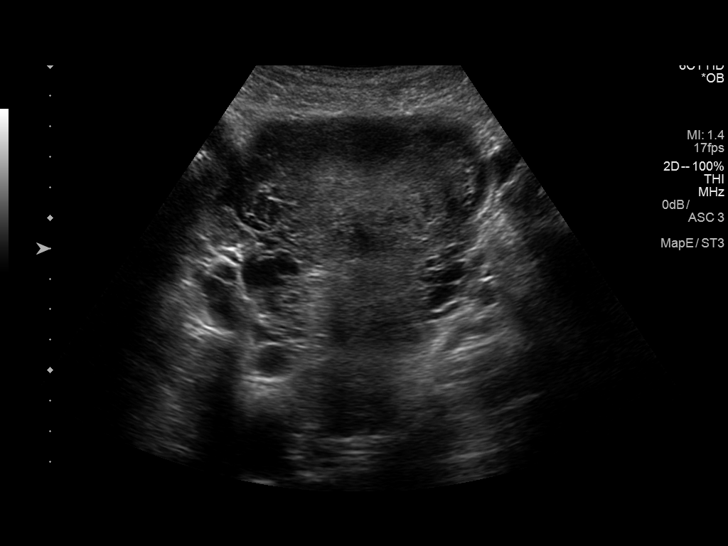
[im 24/49]
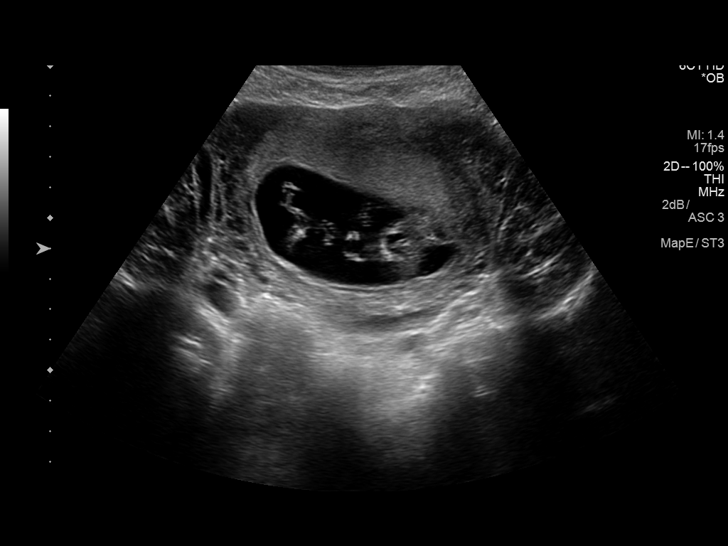
[im 27/49]
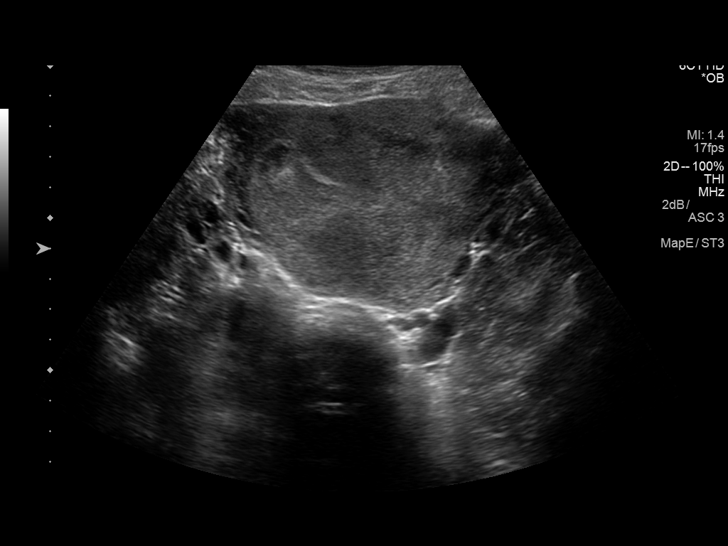
[im 31/49]
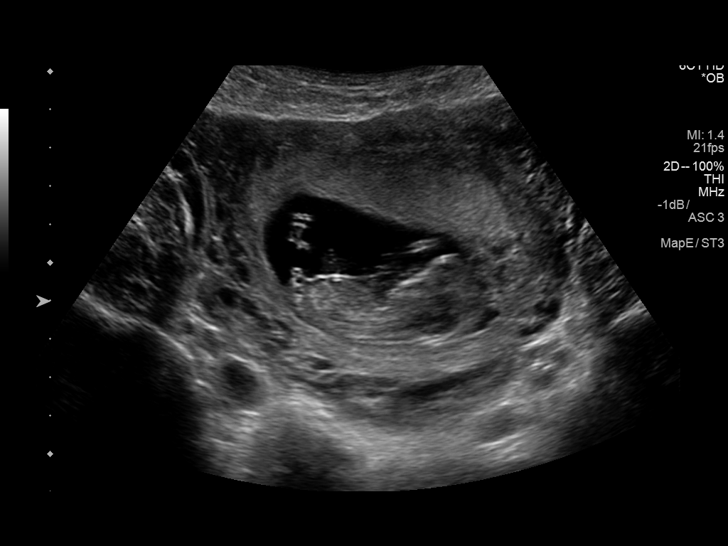
[im 34/49]
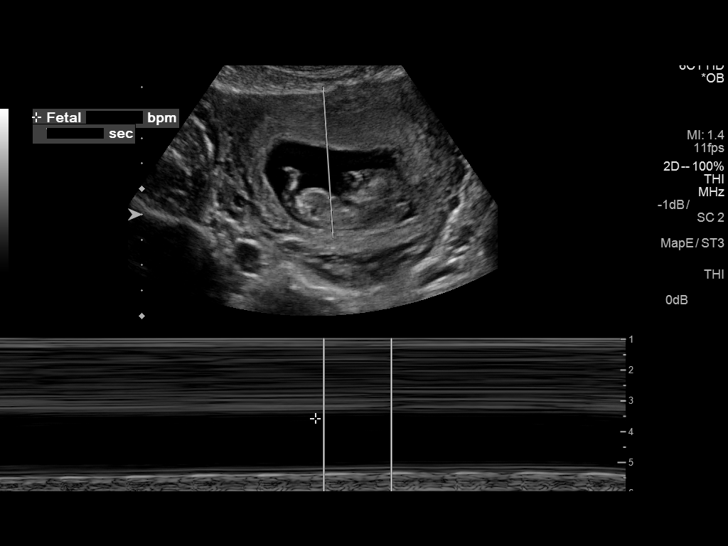
[im 38/49]
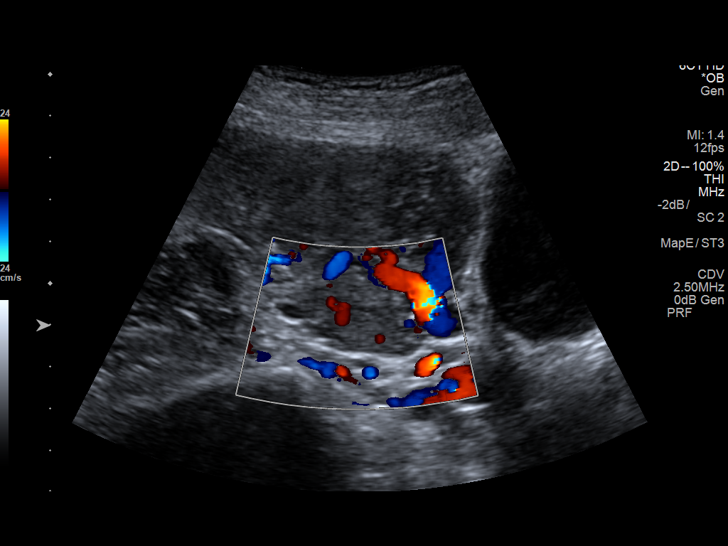
[im 41/49]
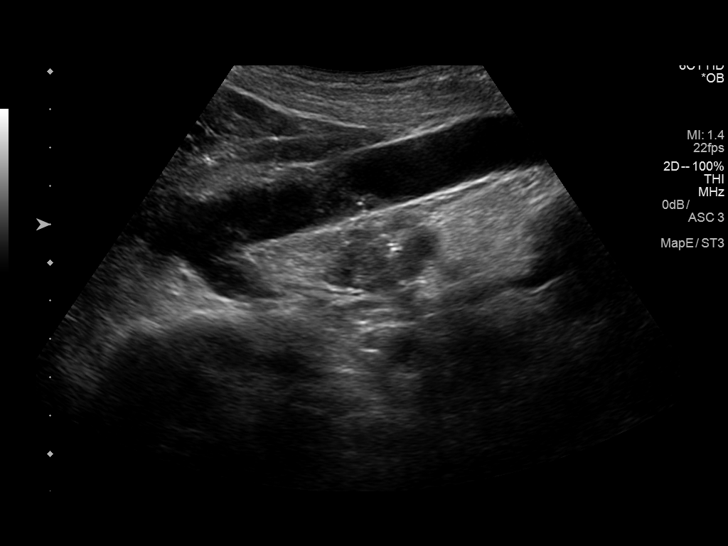
[im 45/49]
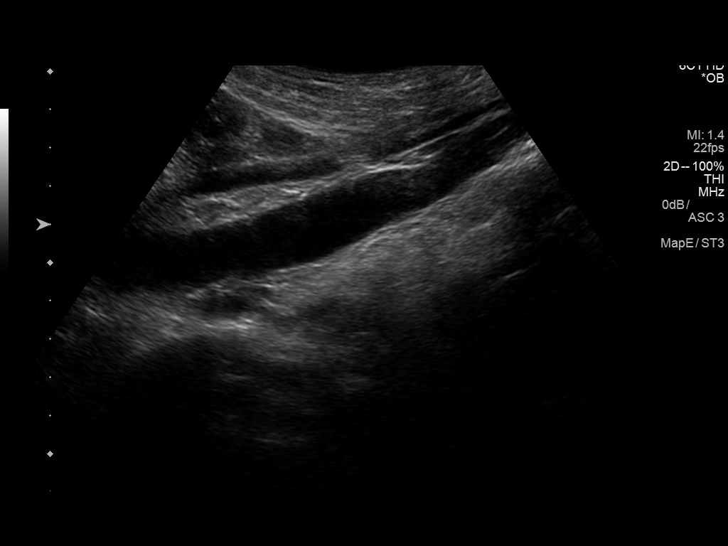
[im 49/49]
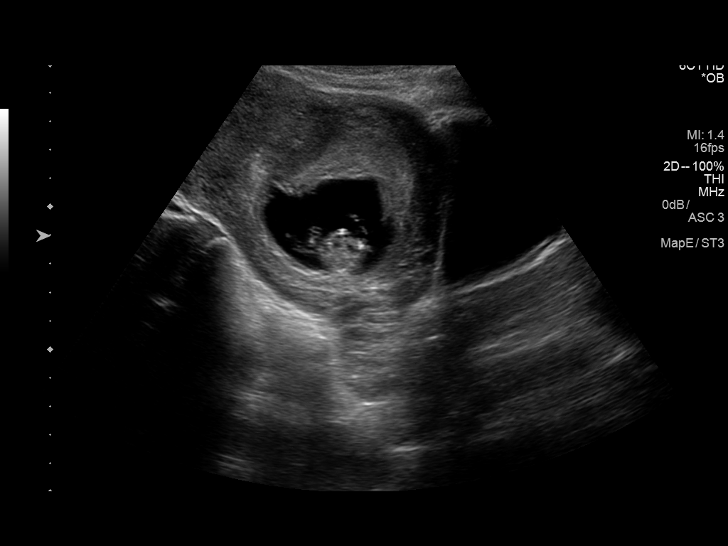

[14 of 28 positions shown; findings below may reference images not displayed]

FINDINGS: Intrauterine gestational sac: Present, single

Yolk sac:  Not visualized

Embryo:  Present

Cardiac Activity: Present

Heart Rate: 452 bpm

CRL: 51.5 mm   11 w   6 d                  US EDC: 08/18/2019

Subchorionic hemorrhage:  None

Maternal uterus/adnexae:

RIGHT ovary normal size and morphology, 3.7 x 2.0 x 1.7 cm.

LEFT ovary not visualized.

No free pelvic fluid or adnexal masses.
IMPRESSION: Single live intrauterine gestation at 11 weeks 6 days EGA.

No acute abnormalities.

## 2020-05-31 ENCOUNTER — Other Ambulatory Visit: Payer: Self-pay | Admitting: Family Medicine

## 2020-05-31 DIAGNOSIS — R102 Pelvic and perineal pain: Secondary | ICD-10-CM

## 2020-06-07 ENCOUNTER — Ambulatory Visit: Payer: Medicaid Other

## 2020-08-03 ENCOUNTER — Encounter: Payer: Self-pay | Admitting: Emergency Medicine

## 2020-08-03 ENCOUNTER — Other Ambulatory Visit: Payer: Self-pay

## 2020-08-03 ENCOUNTER — Emergency Department
Admission: EM | Admit: 2020-08-03 | Discharge: 2020-08-03 | Disposition: A | Discharge status: Home / Self Care | Payer: Medicaid Other | Attending: Emergency Medicine | Admitting: Emergency Medicine

## 2020-08-03 ENCOUNTER — Telehealth: Payer: Self-pay | Admitting: Medical Oncology

## 2020-08-03 ENCOUNTER — Emergency Department: Payer: Medicaid Other

## 2020-08-03 DIAGNOSIS — J1282 Pneumonia due to coronavirus disease 2019: Secondary | ICD-10-CM | POA: Insufficient documentation

## 2020-08-03 DIAGNOSIS — U071 COVID-19: Secondary | ICD-10-CM | POA: Diagnosis not present

## 2020-08-03 DIAGNOSIS — J45909 Unspecified asthma, uncomplicated: Secondary | ICD-10-CM | POA: Diagnosis not present

## 2020-08-03 DIAGNOSIS — Z91010 Allergy to peanuts: Secondary | ICD-10-CM | POA: Insufficient documentation

## 2020-08-03 DIAGNOSIS — M7918 Myalgia, other site: Secondary | ICD-10-CM | POA: Diagnosis present

## 2020-08-03 LAB — RESP PANEL BY RT PCR (RSV, FLU A&B, COVID)
Influenza A by PCR: NEGATIVE
Influenza B by PCR: NEGATIVE
Respiratory Syncytial Virus by PCR: NEGATIVE
SARS Coronavirus 2 by RT PCR: POSITIVE — AB

## 2020-08-03 MED ORDER — SODIUM CHLORIDE 0.9 % IV BOLUS: 1000.0000 mL | Freq: Once | INTRAVENOUS | Status: DC
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
## 2020-08-03 NOTE — ED Notes (Signed)
Informed Anette Riedel RN that she need to leave to pick up her other children  Provider aware
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
